# Patient Record
Sex: Female | Born: 1975 | Race: White | Hispanic: No | Marital: Single | Smoking: Former smoker
Health system: Southern US, Community
[De-identification: ages and names within clinical notes are randomized; demographics above are authoritative.]

## PROBLEM LIST (undated history)

## (undated) DIAGNOSIS — F419 Anxiety disorder, unspecified: Secondary | ICD-10-CM

## (undated) DIAGNOSIS — T7840XA Allergy, unspecified, initial encounter: Secondary | ICD-10-CM

## (undated) DIAGNOSIS — G43909 Migraine, unspecified, not intractable, without status migrainosus: Secondary | ICD-10-CM

## (undated) HISTORY — PX: TUBAL LIGATION: SHX77

## (undated) HISTORY — PX: ABLATION: SHX5711

## (undated) HISTORY — PX: JOINT REPLACEMENT: SHX530

## (undated) HISTORY — PX: HERNIA REPAIR: SHX51

## (undated) HISTORY — PX: FRACTURE SURGERY: SHX138

## (undated) HISTORY — DX: Anxiety disorder, unspecified: F41.9

## (undated) HISTORY — PX: FOOT SURGERY: SHX648

## (undated) HISTORY — DX: Allergy, unspecified, initial encounter: T78.40XA

---

## 1999-07-04 ENCOUNTER — Other Ambulatory Visit: Admission: RE | Admit: 1999-07-04 | Discharge: 1999-07-04 | Payer: Self-pay | Admitting: Family Medicine

## 2000-09-15 ENCOUNTER — Other Ambulatory Visit: Admission: RE | Admit: 2000-09-15 | Discharge: 2000-09-15 | Payer: Self-pay | Admitting: Neurology

## 2000-09-21 ENCOUNTER — Encounter: Payer: Self-pay | Admitting: Internal Medicine

## 2000-09-21 ENCOUNTER — Ambulatory Visit (HOSPITAL_COMMUNITY): Admission: RE | Admit: 2000-09-21 | Discharge: 2000-09-21 | Payer: Self-pay | Admitting: Internal Medicine

## 2000-11-10 ENCOUNTER — Ambulatory Visit (HOSPITAL_COMMUNITY): Admission: RE | Admit: 2000-11-10 | Discharge: 2000-11-10 | Payer: Self-pay | Admitting: Obstetrics and Gynecology

## 2000-11-10 ENCOUNTER — Encounter: Payer: Self-pay | Admitting: Obstetrics and Gynecology

## 2002-10-25 ENCOUNTER — Ambulatory Visit (HOSPITAL_COMMUNITY): Admission: AD | Admit: 2002-10-25 | Discharge: 2002-10-25 | Payer: Self-pay | Admitting: Obstetrics and Gynecology

## 2002-11-02 ENCOUNTER — Ambulatory Visit (HOSPITAL_COMMUNITY): Admission: RE | Admit: 2002-11-02 | Discharge: 2002-11-03 | Payer: Self-pay | Admitting: Obstetrics and Gynecology

## 2002-11-03 ENCOUNTER — Ambulatory Visit (HOSPITAL_COMMUNITY): Admission: AD | Admit: 2002-11-03 | Discharge: 2002-11-03 | Payer: Self-pay | Admitting: Internal Medicine

## 2002-11-04 ENCOUNTER — Inpatient Hospital Stay (HOSPITAL_COMMUNITY): Admission: AD | Admit: 2002-11-04 | Discharge: 2002-11-06 | Payer: Self-pay | Admitting: Obstetrics and Gynecology

## 2005-02-05 ENCOUNTER — Ambulatory Visit (HOSPITAL_COMMUNITY): Admission: RE | Admit: 2005-02-05 | Discharge: 2005-02-05 | Payer: Self-pay | Admitting: Obstetrics and Gynecology

## 2005-02-05 ENCOUNTER — Encounter: Payer: Self-pay | Admitting: Obstetrics and Gynecology

## 2006-03-09 HISTORY — PX: ESOPHAGOGASTRODUODENOSCOPY: SHX1529

## 2006-05-25 ENCOUNTER — Encounter: Admission: RE | Admit: 2006-05-25 | Discharge: 2006-05-25 | Payer: Self-pay | Admitting: Internal Medicine

## 2006-12-15 ENCOUNTER — Ambulatory Visit (HOSPITAL_COMMUNITY): Admission: RE | Admit: 2006-12-15 | Discharge: 2006-12-15 | Payer: Self-pay | Admitting: Gastroenterology

## 2006-12-15 ENCOUNTER — Ambulatory Visit: Payer: Self-pay | Admitting: Gastroenterology

## 2007-01-19 ENCOUNTER — Ambulatory Visit: Payer: Self-pay | Admitting: Urgent Care

## 2007-02-15 ENCOUNTER — Ambulatory Visit: Payer: Self-pay | Admitting: Gastroenterology

## 2007-02-15 ENCOUNTER — Ambulatory Visit (HOSPITAL_COMMUNITY): Admission: RE | Admit: 2007-02-15 | Discharge: 2007-02-15 | Payer: Self-pay | Admitting: Gastroenterology

## 2007-10-07 ENCOUNTER — Encounter: Payer: Self-pay | Admitting: Gastroenterology

## 2007-10-07 DIAGNOSIS — R109 Unspecified abdominal pain: Secondary | ICD-10-CM | POA: Insufficient documentation

## 2007-10-07 DIAGNOSIS — R112 Nausea with vomiting, unspecified: Secondary | ICD-10-CM | POA: Insufficient documentation

## 2007-10-07 DIAGNOSIS — K5909 Other constipation: Secondary | ICD-10-CM | POA: Insufficient documentation

## 2007-10-07 DIAGNOSIS — R630 Anorexia: Secondary | ICD-10-CM | POA: Insufficient documentation

## 2007-10-07 DIAGNOSIS — K449 Diaphragmatic hernia without obstruction or gangrene: Secondary | ICD-10-CM | POA: Insufficient documentation

## 2007-10-07 DIAGNOSIS — R1013 Epigastric pain: Secondary | ICD-10-CM | POA: Insufficient documentation

## 2010-07-22 NOTE — Op Note (Signed)
NAMEDHAMAR, GREGORY                 ACCOUNT NO.:  [id number]   MEDICAL RECORD NO.:  [id number]          PATIENT TYPE:  AMB   LOCATION:  DAY                           FACILITY:  [facility]   PHYSICIAN:  Kassie Mends, M.D.      DATE OF BIRTH:  25-Nov-1975   DATE OF PROCEDURE:  02/15/2007  DATE OF DISCHARGE:                               OPERATIVE REPORT   PROCEDURE PERFORMED:  Esophagogastroduodenoscopy.   INDICATION FOR EXAM:  Ms. Clifton Custard is a 35 year old female who was  initially seen for epigastric pain in October of 2008.  It was  associated with nausea, vomiting and anorexia.  She was scheduled for an  upper endoscopy but did not return for her scheduled appointment.  Initially, she was having marital problems and she does consume alcohol  twice a week.  The epigastric pain has been so significant that she has  been seen in the emergency department.  She was seen in the office in  November and changed from omeprazole to Zegerid.   FINDINGS:  1. Normal esophagus without evidence of Barrett's, mass, erosion or      constriction.  2. Small hiatal hernia, otherwise normal stomach.  3. Normal duodenal bulb and second portion of the duodenum.   DIAGNOSES:  Ms. Charlestine Night epigastric pain is either secondary to  gastritis, uncontrolled gastroesophageal reflux disease, or nonulcer  dyspepsia.   RECOMMENDATIONS:  1. She should continue her Zegerid daily.  2. Follow-up appointment in three months with Dr. Cira Servant to reassess      abdominal pain.   MEDICATIONS:  1. Demerol 75 mg IV  2. Versed 7 mg IV.   PROCEDURE TECHNIQUE:  Physical exam was performed and informed consent  was obtained from the patient after explaining the benefits, risks and  alternatives to the procedure.  The patient was connected to a monitor  and placed in left lateral position.  Continuous oxygen was provided by  nasal cannula and IV medicine administered through the indwelling  cannula.  After administration of sedation,  the patient's esophagus was  intubated and the  scope was advanced under direct visualization to the second portion of  the duodenum.  The scope was removed slowly by carefully examining the  color, texture, anatomy and integrity of the mucosa on the way out.  The  patient was recovered in the endoscopy suite and discharged home in  satisfactory condition.      Kassie Mends, M.D.  Electronically Signed     SM/MEDQ  D:  02/15/2007  T:  02/15/2007  Job:  [id number]   cc:   Doreen Beam  Fax: [phone]

## 2010-07-22 NOTE — H&P (Signed)
[name], Sweeney                 ACCOUNT NO.:  [id number]   MEDICAL RECORD NO.:  [id number]          PATIENT TYPE:  AMB   LOCATION:  DAY                           FACILITY:  [facility]   PHYSICIAN:  R. Roetta Sessions, M.D. DATE OF BIRTH:  04-22-1975   DATE OF ADMISSION:  DATE OF DISCHARGE:  LH                              HISTORY & PHYSICAL   PRIMARY CARE PHYSICIAN:  Dr. Sherril Croon.   CHIEF COMPLAINT:  Epigastric pain, nausea and vomiting.   HISTORY OF PRESENT ILLNESS:  Ms. Sara Sweeney is a 35 year old Caucasian  female.  She was initially seen by me on December 15, 2006.  She has a 13-  month history of intermittent epigastric pain that is always  postprandially.  She was seen in the office.  She was started on Zegerid  40 mg daily, which did seem to help her symptoms.  She was set up for  abdominal ultrasound, which was negative on December 15, 2006.  She was  doing quite well on her Zegerid; however, she ran out.  She did have  rare nausea and vomiting and epigastric pain while taking her Zegerid.  She tells me she went to eat at [facility].  She had chicken  quesadilla.  She had severe epigastric pain afterwards along with nausea  and vomiting.  She tells me any time she eats pasta or cheese, she has  symptoms, even when she was on PPI.  She is under a significant amount  of stress due to marital problems.  She tells me she gets very little  sleep.  She denies any history of anxiety and depression.  She denies  any dysphagia or odynophagia.  Denies any rectal bleeding or melena.   We had attempted to contact Ms. Sara Sweeney after her abdominal ultrasound to  give results and set her up for EGD if PPI was not working; however, we  contacted her multiple times and finally ended up mailing a letter.  Ms.  Charlestine Night epigastric pain was so severe on January 17, 2007, she  presented to [facility].  While there, she had a  CMP which was normal.  She had a lipase mildly elevated at 54.   Amylase  was normal.  She had a CBC, which at that time showed a white blood cell  count 9.5, hemoglobin 14.6, hematocrit 42.6 and platelet count of 184.  She had a urinalysis which showed trace ketones and trace blood, along  with occasional bacteria.   PAST MEDICAL AND SURGICAL HISTORY:  She has history of umbilical  herniorrhaphy.  Foot surgery, sinus surgery, myringotomy tubes, tubal  ligation, endometrial ablation four years ago.   CURRENT MEDICATIONS:  1. Xanax 1 mg b.i.d. p.r.n.  2. Protonix 40 mg daily was started in the emergency room yesterday.  3. She is on Ultram p.r.n.   ALLERGIES:  NO KNOWN DRUG ALLERGIES.   FAMILY HISTORY:  There is no known family history of carcinoma or  chronic GI problems.  Both parents are healthy.  She does have a sister  with history of astrocytoma.  SOCIAL HISTORY:  Ms. Sara Sweeney is married although admits to having marital  difficulties.  She had recently separated from husband and they are now  currently living back together again, although she admits that this may  not be permanent.  she has a 14 pack year history of tobacco use but  does not currently smoke.  She consumes a couple of alcoholic beverages  per week.  Denies any drug use.   REVIEW OF SYSTEMS:  See HPI, otherwise negative.   PHYSICAL EXAMINATION:  VITAL SIGNS:  Weight is 127 pounds, height 64  inches, temperature 98.1 degrees, blood pressure 92/70, pulse 72.  GENERAL:  Ms. Sara Sweeney is a 35 year old Caucasian female who is alert,  cooperative, in no acute distress.  HEENT:  Sclerae clear, nonicteric.  Conjunctivae pink.  Oropharynx pink  and moist without lesions.  NECK: Supple without thyromegaly.  CHEST: Heart regular rhythm, normal S1, S2.  No murmurs, clicks, rubs or  gallops.  RESPIRATORY:  Lungs clear to auscultation bilaterally.  ABDOMEN:  Positive bowel sounds x4.  No bruits auscultated.  Soft,  nontender and nondistended without palpable mass or hepatosplenomegaly.   No rebound tenderness or guarding.  EXTREMITIES:  Without clubbing or edema bilaterally.  SKIN:  Pink, warm, dry without any rash or jaundice.   IMPRESSION:  Ms. Sara Sweeney is a 35 year old Caucasian female with  intermittent epigastric pain along with nausea and vomiting.  Her  symptoms are usually postprandial.  Gallbladder ultrasound was normal.  I suspect she may have gastritis or peptic ulcer disease to account for  her symptoms and warrants further evaluation with EGD.  If EGD is  benign, would consider acalculous gallbladder disease and further  workup.   PLAN:  1. EGD with Dr. Cira Servant in the near future.  Discuss the procedure,      risks, benefits, including but not limited to the risk of      infection, perforation, drug reaction.  She agrees to plan.      Consent will be obtained.  2. Resume Zegerid 40 mg daily.  I have given her bottle of samples and      she has a prescription for 30 with two refills.  We have given her      a rebate card.  3. She has Ultram on hand for severe pain, although she notes this has      been causing headache.  She denies any further pain medicines at      this time.      Lorenza Burton, N.P.      Jonathon Bellows, M.D.  Electronically Signed    KJ/MEDQ  D:  01/19/2007  T:  01/20/2007  Job:  [id number]   cc:   Doreen Beam  Fax: [phone]

## 2010-07-22 NOTE — Assessment & Plan Note (Signed)
NAME:  VERENA, SHAWGO                  CHART#:  [id number]   DATE:  12/15/2006                       DOB:  10/15/1975   CHIEF COMPLAINT:  Epigastric pain, nausea, vomiting.   HISTORY OF PRESENT ILLNESS:  Ms. Clifton Custard is a 35 year old Caucasian female  who tells me for the last year she has had intermittent epigastric pain  that is always is postprandial.  It is associated with nausea and  vomiting, as well as anorexia.  The pain can last anywhere from 1/2 hour  to all day long.  She has episodes, generally a couple of days per week,  but not necessarily every day.  She describes the pain as 8/10 on pain  scale, and aching.  She does complain of indigestion throughout the week  as well.  She denies any dysphagia or odynophagia.  Does have some  anorexia.  Denies any early satiety.  She has taken Carafate 1 gm  q.i.d., which does not seem to help.  She has been taking a trial of  Nexium 40 mg daily, but does admit to not taking it as prescribed.  She  is taking it only when needed.  She has not noticed any difference with  her symptoms with the Nexium.  She has chronic constipation.  She  generally has 1 to 2 bowel movements a week.  She denies any rectal  bleeding or melena.   PAST MEDICAL HISTORY:  1. She presently has URI.  2. History of umbilical herniorrhaphy.  3. Foot surgery.  4. Sinus surgery.  5. Myringotomy tubes.  6. Tubal ligation and endometrial ablation 4 years ago.   CURRENT MEDICATIONS:  1. Carafate 1 gm q.i.d.  2. Xanax 1 mg b.i.d. p.r.n.  3. Allegra 180 mg daily.  4. Amoxicillin 500 mg b.i.d. for 7 days.  5. Robitussin over-the-counter p.r.n.  6. Nexium 40 mg daily p.r.n.   ALLERGIES:  NO KNOWN DRUG ALLERGIES.   FAMILY HISTORY:  No known family history of colorectal carcinoma, liver,  or GI problems.  Both parents are healthy.  She does have a sister with  astrocytoma.   SOCIAL HISTORY:  Ms. Clifton Custard is married.  She has 2 healthy children ages  65 and 83.  She is  employed with [employer].  She has a 14-pack-year  history of tobacco use, but does not currently smoke.  She consumes a  couple of alcoholic beverages per week.  Denies any drug use.   REVIEW OF SYSTEMS:  See HPI.  Her weight is up 8 pounds in the last  year, otherwise negative.   PHYSICAL EXAMINATION:  VITAL SIGNS:  Weight 128 pounds.  Height 64  inches.  Temperature 97.7.  Blood pressure 110/70.  Pulse 68.  GENERAL:  She is a well-developed, well-nourished, Caucasian female in  no acute distress.  HEENT:  Sclerae are clear.  Not injected.  Oropharynx pink and moist  without any lesions.  NECK:  Supple without adenopathy or thyromegaly.  CHEST:  Heart regular rate and rhythm.  Normal S1 and S2.  No murmurs,  rubs, gallops.  LUNGS:  Clear to auscultation bilaterally.  ABDOMEN:  Positive bowel sounds x4.  No bruits auscultated.  Soft and  non-distended.  She does have significant Murphy's point tenderness, as  well as tenderness to her entire upper  abdomen.  There is no rebound  tenderness or guarding.  No hepatosplenomegaly or mass.  EXTREMITIES:  Without clubbing or edema bilaterally.  SKIN:  Pink, warm, and dry without rash or jaundice.   IMPRESSION:  Ms. Clifton Custard is a 35 year old Caucasian female with a 1-year  history of intermittent severe epigastric pain, which is always  postprandial, and associated with nausea and vomiting.  She also  complains of indigestion and chronic constipation.  She admits to not  taking proton pump inhibitor on a daily basis, and giving an adequate  trial for gastroesophageal reflux disease.  I suspect that we could be  dealing with gallbladder disease as well.   PLAN:  1. Constipation literature reviewed.  2. Maalox 17 gm daily.  3. Zegerid 40 mg daily, samples given for 2 weeks.  She is to take      every day.  4. Abdominal ultrasound.  5. If abdominal ultrasound is normal, would proceed with EGD for      further evaluation by Dr. Cira Servant.  I have  discussed the procedure,      risks, and benefits including, but not limited to infection,      perforation, and consent to be obtained if necessary.       Lorenza Burton, N.P.  Electronically Signed     Kassie Mends, M.D.  Electronically Signed    KJ/MEDQ  D:  12/16/2006  T:  12/16/2006  Job:  [id number]   cc:   Doreen Beam

## 2010-07-25 NOTE — Op Note (Signed)
NAMELORRY, FURBER NO.:  [id number]   MEDICAL RECORD NO.:  [id number]          PATIENT TYPE:  AMB   LOCATION:  DAY                           FACILITY:  [facility]   PHYSICIAN:  Tilda Burrow, M.D. DATE OF BIRTH:  10/12/1975   DATE OF PROCEDURE:  02/05/2005  DATE OF DISCHARGE:                                 OPERATIVE REPORT   PREOPERATIVE DIAGNOSIS:  Elective sterilization, menorrhagia.   POSTOPERATIVE DIAGNOSIS:  Elective sterilization, menorrhagia.   PROCEDURE:  Hysterectomy and dilation and curettage, endometrial ablation,  laparoscopic tubal sterilization with Falope rings.   SURGEON:  Dr. Emelda Fear.   ASSISTANT:  Lewellyn_ C.S.T.   ANESTHESIA:  General, Idacavage, C.R.N.A.   COMPLICATIONS:  None.   FINDINGS:  Shaggy endometrial. This patient is beginning a menses, easily  cleared with curettage, normal appearing tubes and ovaries bilaterally.   DETAILS OF PROCEDURE:  The patient was taken to the operating room, prepped  and draped in the usual fashion for combined abdominal and vaginal procedure  with legs in low lithotomy support with patient prepped and draped for  combined abdominal and vaginal procedure. The vaginal portion was initiated  with speculum insertion, grasping the anterior lip of the cervix with a  single-toothed tenaculum, dilating the cervix to 21 [place] after uterine  sounding to 9 cm. The HTA endometrial ablation device was then inserted to  use as a hysteroscope initially, visualizing the shaggy endometrium. We then  removed the shaggy endometrium with four-quadrant curettage. Repeat  hysteroscopy revealed a dramatically improved appearance of the endometrial  cavity. The generous curettings were sent as a histologic specimen.   The HTA endometrial ablation process then was executed, and a 10-minute  thermal process utilized, with satisfactory thermal changes documented by  photography, pre and post procedure. There was no  significant fluid loss. An  Allis clamp was placed on the cervix to maximize the satisfactory seal of  the endocervix. This worked excellently. The thermal changes were completed,  the hysteroscopic ablation fluid cooled down and removed, the hysteroscope  removed, and then procedure considered successfully completed. Paracervical  block with 20 cc of Marcaine with epinephrine was then instilled into the  area lateral and posterior to the cervix, and then we proceeded with  placement of the single-toothed tenaculum on the cervix, Hulka tenaculum  placed in the uterus, in and out catheterization of the bladder performed.  Tubal ligation was then performed. We changed gloves and gowns, flipped the  protective drape over the vaginal area, and then proceeded with laparoscopic  tubal sterilization in standard fashion which consisted of infraumbilical  vertical 1-cm skin incision as well as a transverse suprapubic 1-cm skin  incision. Veress needle was introduced through the umbilical incision while  elevating the abdominal wall, oriented the needle toward the pelvis, and  loss-of-resistance technique confirmed satisfactory intraperitoneal  location. Two liters of CO2 was introduced under 4 mm pressure, the 5-mm  laparoscopic trocar introduced through the umbilical incision, confirming  normal appearing bowel. The cul-de-sac was inspected, and there was no  significant fluid, again confirming no  extravasation from the tubes from the  thermal ablation procedure. Each fallopian tube was then identified to each  fimbriated end, found to be free of adhesions as was the ovary. We then  elevated the tube and placed a mid segment knuckle of tube in a Falope ring.  The ring applier had been placed through the suprapubic trocar site using an  8-mm sleeve. The Falope ring was applied on the left tube first. The tube  was then infiltrated with 5 cc of Marcaine with epinephrine, then we  inspected the over  side and performed a similar procedure. Satisfactory  placement of the ring was photo documented. Saline 200 cc was instilled into  the abdomen. The first 100 cc were slightly cooled than desired, but second  100 cc was normal temperature for the patient. We then proceeded to remove  laparoscopic equipment, closed the incisions with subcuticular 4-0 Dexon,  closed the skin with Steri-Strips and allowed the patient to go to the  recovery room in good condition.   DISCHARGE MEDICATIONS:  1.  Tylox x20 tablets.  2.  Motrin 600 mg every 6 hours x30 tablets.  3.  Phenergan 25 mg tablets x10 tablets 1 every 6 hours as needed.   The patient went home after going to recovery room in stable condition.      Tilda Burrow, M.D.  Electronically Signed     JVF/MEDQ  D:  02/05/2005  T:  02/05/2005  Job:  [id number]

## 2010-07-25 NOTE — Discharge Summary (Signed)
   NAME:  Sara Sweeney, Sara Sweeney                           ACCOUNT NO.:  [id number]   MEDICAL RECORD NO.:  [id number]                   PATIENT TYPE:  INP   LOCATION:  LDR5                                 FACILITY:  [facility]   PHYSICIAN:  Langley Gauss, M.D.                DATE OF BIRTH:  06-Nov-1975   DATE OF ADMISSION:  11/04/2002  DATE OF DISCHARGE:  11/06/2002                                 DISCHARGE SUMMARY   DIAGNOSIS:  Term pregnancy in labor.  Delivery performed by Dr. Roylene Reason.  Lisette Grinder on cross-coverage arrangement with [facility].   ADDITIONAL PROCEDURES:  1. Continuous lumbar epidural analgesia placed on November 04, 2002 by Dr.     Roylene Reason. Lisette Grinder.  2. On November 06, 2002 infant circumcision performed without complications by     Dr. Roylene Reason. Lisette Grinder.   PERTINENT LABORATORY STUDIES:  Admission hemoglobin and hematocrit 10.3/31.6  with white count of 12.0; postpartum day #1 10.0/30.9 with a white count of  13.9.  B positive blood type.   DISCHARGE MEDICATIONS:  Darvocet-N 100.   DISPOSITION:  To follow up with Dr. Duane Lope in four weeks' time.   HOSPITAL COURSE:  See previous dictations.  The patient did well postpartum.  She had no postpartum complications, bonded well with the infant.  She was  thus prepared for discharge on postpartum day #1.5.  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  She was treated  during the course of labor with IV ampicillin and subsequently the infant  was observed x48 hours postpartum per pediatrician.                                               Langley Gauss, M.D.    DC/MEDQ  D:  11/07/2002  T:  11/07/2002  Job:  [id number]   cc:   [facility]

## 2010-07-25 NOTE — Op Note (Signed)
   NAME:  Sara Sweeney, Sara Sweeney                           ACCOUNT NO.:  [id number]   MEDICAL RECORD NO.:  [id number]                   PATIENT TYPE:  INP   LOCATION:  LDR5                                 FACILITY:  [facility]   PHYSICIAN:  Langley Gauss, M.D.                DATE OF BIRTH:  07/12/1975   DATE OF PROCEDURE:  11/04/2002  DATE OF DISCHARGE:                                 OPERATIVE REPORT   PROCEDURE:  Placement of continuous lumbar epidural analgesia at the L2-L3  interspace.   DESCRIPTION OF PROCEDURE:  Appropriate informed consent was obtained.  Fetal  heart rate [place] reassuring.  The patient is placed in the seated position.  Bony landmarks were identified.  The L2-L3 interspace was chosen.  The  patient's back is sterilely prepped and draped in the utilizing epidural  tray and 5 mL of 1% lidocaine plain injected at the midline of the L2-3  interspace to raise a small skin weal.  The 17-gauge Tuohy-Schliff needle  was then utilized with loss-of-resistance and air-filled glass syringe to  easily and atraumatically identify entry into the epidural space on the  first attempt without difficulty.  Excellent loss-of-resistance was noted  and 5 mL 1.5% lidocaine plus epinephrine injected through the epidural  needle.  No signs of CSF or intravascular injection obtained.  Thus, the  epidural catheter is inserted to a depth of 5 cm. The epidural needle is  removed.  Aspiration test is negative.  Second test dose, 2 mL 1.5%  lidocaine plus epinephrine injected through the epidural catheter.  Again no  signs of CSF or intravascular injection obtained.  Thus  a second test dose  of 3 mL of 1.5% lidocaine plus epinephrine injected through the epidural  catheter.  Again no signs of CSF or intravacular injection obtained.  The  patient is having tingling in the feet consistent with a setting up epidural  block.  The catheter is secured in place.  The patient is connected to the  infusion pump with  the standard mixture.  She will be treated with a bolus  of 10 mL followed by continuous infusion rate of 14 mL per hour. The patient  is thereafter returned to the lateral supine position at which time she  continues to have a reassuring fetal heart rate and there is evidence of a  excellent bilateral block setting up which should be adequate for labor  analgesia.                                               Langley Gauss, M.D.    DC/MEDQ  D:  11/04/2002  T:  11/04/2002  Job:  [id number]

## 2010-07-25 NOTE — H&P (Signed)
NAME:  Sara Sweeney, Sara Sweeney                           ACCOUNT NO.:  [id number]   MEDICAL RECORD NO.:  [id number]                   PATIENT TYPE:  INP   LOCATION:  LDR5                                 FACILITY:  [facility]   PHYSICIAN:  Langley Gauss, M.D.                DATE OF BIRTH:  20-Jun-1975   DATE OF ADMISSION:  11/04/2002  DATE OF DISCHARGE:                                HISTORY & PHYSICAL   ADMISSION HISTORY AND PHYSICAL:  A 35 year old gravida 2, para 1, 39-1/[redacted]  weeks gestation who presents to [facility] in active labor.  The  patient has been seen the previous two days in prodromal labor with no  cervical change; however, she presents today complaining of increased  frequency in intensity of uterine contractions, and on initial examination,  by the nursing staff, the cervix is noted to have changed to 3-cm.  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  She will thus require treatment during labor in an  effort to prevent vertical transmission to the infant.  The patient has had  serial ultrasounds which documented adequate fetal growth and a normal  anatomic survey.  Remainder of prenatal course uncomplicated.   PAST MEDICAL HISTORY:   OB HISTORY:  Spontaneous assisted vaginal delivery x 1, 1997, in [place], 8  pound 7 ounce female infant, delivered without complication.  The patient  describes her labor as being rapid.   SURGICAL HISTORY:  1. Patient has had right foot surgery.  2. Tubes placed in her ears.  3. Nasal polyp removal.   She has no known drug allergies.   CURRENT MEDICATIONS:  Include prenatal vitamins only.   PHYSICAL EXAMINATION:  GENERAL:  A pleasant white female, no acute distress.  VITAL SIGNS:  Height 5 foot 5, 110/70, pulse is 76, respiratory rate is 22.  HEENT:  Negative.  No adenopathy.  NECK:  Supple.  Thyroid is not palpable.  LUNGS:  Clear.  CARDIOVASCULAR:  Regular, rate and  rhythm.  ABDOMEN:  Soft and nontender.  No surgical scars are identified.  The  patient is vertex presentation by [facility] maneuver with a fundal height of  38-cm.  EXTREMITIES:  Noted to be normal.  PELVIC:  Normal external genitalia.  No lesions or ulcerations identified.  No leakage or vaginal bleeding.  Pelvis is noted to be clinically adequate.  Initial examination by the nursing staff, 3-cm; thereafter, the patient  progressed rapidly along the labor curve to a 6-cm dilatation with a  reassuring fetal heart rate, contractions occurring every four to seven  minutes.  The patient did experience spontaneous rupture of membranes,  during this hospitalization, with findings of slightly particulate and green  meconium stained amniotic fluid.   ASSESSMENT/PLAN:  1. Patient presents in active labor.  She does desire epidural analgesic for  labor relief.  2. Amnioinfusion will be performed in an effort to dilute the meconium.  3. Patient will require treatment with intravenous Ampicillin during the     course of labor due to the Group B streptococcus carrier status.                                               Langley Gauss, M.D.    DC/MEDQ  D:  11/04/2002  T:  11/04/2002  Job:  [id number]   cc:   [facility]

## 2010-07-25 NOTE — Op Note (Signed)
NAME:  Sara Sweeney, Sara Sweeney                           ACCOUNT NO.:  [id number]   MEDICAL RECORD NO.:  [id number]                   PATIENT TYPE:  INP   LOCATION:  LDR1                                 FACILITY:  [facility]   PHYSICIAN:  Langley Gauss, M.D.                DATE OF BIRTH:  January 02, 1976   DATE OF PROCEDURE:  11/04/2002  DATE OF DISCHARGE:                                 OPERATIVE REPORT   DELIVERY NOTE:   DIAGNOSES:  1. Thirty-nine and one-half week intrauterine pregnancy presenting in     active.  2. Mildly particulate meconium stain of the amniotic fluid.   OTHER PROCEDURES:  1. Amnio infusion in an effort to dilute the meconium fluid, 1000 mL of     sterile normal saline is instilled through the intrauterine pressure     catheter monitor.  2. Placement of continuous lumbar epidural analgesia by Dr. Roylene Reason.     Lisette Grinder, epidural start time 1400,  Epidural discontinuation time 1830.   COMPLICATIONS:  Complications of labor and delivery include a prolonged  fetal heart rate deceleration of 80-90 beats per minute secondary to  hypotension.  The patient had only received 1 L of IV fluids total before  epidural was placed.  The hypotension resolved with treatment with  additional fluid bolus as well as 5 mg of IV ephedrine.   SUMMARY:  A 35 year old gravida 2, para 1, 39-1/[redacted] weeks gestation, presents  in active labor.  Initial examination is 3 cm dilated, but after progression  to 6 cm at which time the patient had received 1 mg of IV Nubain for pain  relief, she did have to request epidural.  Epidural was placed without  difficulty and functioned very well through the remainder of the labor  course.  The patient was noted to have excellent bilateral block in place.  She was examined and noted to be 10 cm.  However, contractions were  irregular every five to eight minutes at that time, thus Pitocin  augmentation was initiated for findings of dysfunctional labor with  inadequate frequency and intensity of uterine contractions.  After  initiating the Pitocin and turning off the epidural pump, the patient began  having pressure and urge to push.  She was thus placed in the dorsal  lithotomy position and prepped and draped in the usual sterile manner.  Preparations were made to deal with meconium stained amniotic fluid,  specifically with a DeLee suction catheter kit connected to wall suction.  Pediatrician, Dr. Vivia Ewing, had previously been notified and was on his  way in for delivery.  With the patient now in the dorsal lithotomy position,  Foley catheter was removed.  The patient pushed very well with descent to  the vertex to the perineal floor with distention of the perineum.  A total  of 30 mL of 1% lidocaine plain is injected in the perineal body.  A small  midline episiotomy was performed.  The mother pushed well.  The infant  delivered in a direct OA position over the midline episiotomy without  extension.  Mouth and nares of the instrument were DeLee suctioned on the  perineum prior to the delivery of the shoulders.  Prior to the first breath  or cry, 3 mL of straw-colored meconium stained amniotic fluid is obtained.  Continuous expulsive efforts resulted in delivery of the remainder of the  infant.  The umbilical cord was milked towards the infant.  Cord is  thoroughly clamped and cut and the infant is noted to have some spontaneous  breathing and crying as I handed the baby off to the nurses at the nursery  table.  Arterial cord gas and cord blood are then obtained.  Gentle traction  on the umbilicus cord results in separation which upon examination appears  to be an intact placenta associated through three vessel umbilical cord.  Excellent uterine tone is achieved.  Examination of the genital tract  reveals midline episiotomy which is not extended.  This is reperitonealized  utilizing a 2-0 Vicryl continuous suture in the deep muscle layer of  the  perineal body, most specifically the superficial transverse perineal muscle  which is then reapproximated midline.  The vaginal mucosa is closed with a  continuous running 0 chromic in a running locked fascia followed by a single  layer of 0 chromic on the perineal body which restores the normal anatomy.  The patient is then taken out of the dorsal lithotomy position and rolled to  her side at which time the epidural was removed with the blue tip noted the  be intact.  Both the mother and the infant doing very well following  delivery.  There were no complications.  Total estimated blood loss less  than 500 mL.                                                Langley Gauss, M.D.    DC/MEDQ  D:  11/04/2002  T:  11/04/2002  Job:  [id number]

## 2014-05-29 ENCOUNTER — Emergency Department (HOSPITAL_COMMUNITY)
Admission: EM | Admit: 2014-05-29 | Discharge: 2014-05-29 | Disposition: A | Discharge status: Home / Self Care | Payer: Self-pay | Attending: Emergency Medicine | Admitting: Emergency Medicine

## 2014-05-29 ENCOUNTER — Encounter (HOSPITAL_COMMUNITY): Payer: Self-pay | Admitting: Neurology

## 2014-05-29 DIAGNOSIS — Z8679 Personal history of other diseases of the circulatory system: Secondary | ICD-10-CM | POA: Insufficient documentation

## 2014-05-29 DIAGNOSIS — R519 Headache, unspecified: Secondary | ICD-10-CM

## 2014-05-29 DIAGNOSIS — R51 Headache: Secondary | ICD-10-CM | POA: Insufficient documentation

## 2014-05-29 DIAGNOSIS — Z87891 Personal history of nicotine dependence: Secondary | ICD-10-CM | POA: Insufficient documentation

## 2014-05-29 DIAGNOSIS — M542 Cervicalgia: Secondary | ICD-10-CM | POA: Insufficient documentation

## 2014-05-29 HISTORY — DX: Migraine, unspecified, not intractable, without status migrainosus: G43.909

## 2014-05-29 MED ORDER — NAPROXEN 250 MG PO TABS
500.0000 mg | ORAL_TABLET | Freq: Once | ORAL | Status: AC
Start: 2014-05-29 — End: 2014-05-29
  Administered 2014-05-29: 500 mg via ORAL
  Filled 2014-05-29: qty 2

## 2014-05-29 MED ORDER — OXYCODONE-ACETAMINOPHEN 5-325 MG PO TABS
1.0000 | ORAL_TABLET | Freq: Once | ORAL | Status: AC
  Administered 2014-05-29: 1 via ORAL
  Filled 2014-05-29: qty 1

## 2014-05-29 MED ORDER — NAPROXEN 500 MG PO TABS: 500.0000 mg | ORAL_TABLET | Freq: Two times a day (BID) | ORAL | Status: DC

## 2014-05-29 NOTE — ED Provider Notes (Signed)
CSN: [id number]     Arrival date & time 05/29/14  1046 History   First MD Initiated Contact with Patient 05/29/14 1204     Chief Complaint  Patient presents with  . Neck Pain   (Consider location/radiation/quality/duration/timing/severity/associated sxs/prior Treatment) HPI  Sara Sweeney is a 39 yo female presenting with scalp pain.  She reports she first noticed she felt soreness in her neck 1 week ago.  She had full range of motion but it felt tender when she turned her head.  A day later the soreness in her neck muscles improved but she noticed pain in the back of her scalp. She reports a migraine headache 4 days ago [facility].  She reports a head CT there that was negative.  She currently denies any headache, nausea, vomiting, focal neuro deficits, fever, chills or neck stiffness.    Past Medical History  Diagnosis Date  . Migraine    History reviewed. No pertinent past surgical history. No family history on file. History  Substance Use Topics  . Smoking status: Former Games developermoker  . Smokeless tobacco: Not on file  . Alcohol Use: No   OB History    No data available     Review of Systems  Constitutional: Negative for fever and chills.  HENT: Negative for ear pain and sore throat.   Eyes: Negative for visual disturbance.  Respiratory: Negative for shortness of breath.   Cardiovascular: Negative for chest pain.  Gastrointestinal: Negative for nausea and vomiting.  Genitourinary: Negative for dysuria.  Musculoskeletal: Negative for myalgias.  Skin: Negative for rash.  Neurological: Positive for headaches. Negative for weakness and numbness.  Hematological: Positive for adenopathy.    Allergies  Review of patient's allergies indicates no known allergies.  Home Medications   Prior to Admission medications   Not on File   BP 111/66 mmHg  Pulse 77  Temp(Src) 98.3 F (36.8 C) (Oral)  Resp 23  Ht 5\' 3"  (1.6 m)  Wt 160 lb  (72.576 kg)  BMI 28.35 kg/m2  SpO2 100% Physical Exam  Constitutional: She appears well-developed and well-nourished. No distress.  HENT:  Head: Normocephalic and atraumatic.  Right Ear: Tympanic membrane normal.  Left Ear: Tympanic membrane normal.  Eyes: Conjunctivae are normal.  Neck: Neck supple. No thyromegaly present.  Cardiovascular: Normal rate, regular rhythm and intact distal pulses.   Pulmonary/Chest: Effort normal and breath sounds normal. No respiratory distress. She has no wheezes. She has no rales. She exhibits no tenderness.  Abdominal: Soft. There is no tenderness.  Musculoskeletal: She exhibits no tenderness.  Lymphadenopathy:       Head (left side): Occipital adenopathy present.    She has no cervical adenopathy.  Neurological: She is alert. She has normal strength. No cranial nerve deficit or sensory deficit. GCS eye subscore is 4. GCS verbal subscore is 5. GCS motor subscore is 6.  Cranial nerves 2-12 intact  Skin: Skin is warm and dry. No rash noted. She is not diaphoretic.  Psychiatric: She has a normal mood and affect.  Nursing note and vitals reviewed.   ED Course  Procedures (including critical care time) Labs Review Labs Reviewed - No data to display  Imaging Review No results found.   EKG Interpretation None      MDM   Final diagnoses:  Scalp pain  Neck pain   39 yo with swollen lymph node in occipital scalp. Discussed case with Dr. Patria Maneampos.  Treated with NSAIDS  and pain meds in the ED. Pt is well-appearing, in no acute distress and vital signs reviewed and not concerning. She appears safe to be discharged.  Discharge include follow-up with their PCP and treatment with NSAIDS.  Return precautions provided. Pt aware of plan and in agreement.     Filed Vitals:   05/29/14 1215 05/29/14 1230 05/29/14 1245 05/29/14 1300  BP: 111/66 103/64 118/75 126/69  Pulse: 77 62 64 79  Temp:      TempSrc:      Resp: Height:      Weight:       SpO2: 100% 100% 99% 99%   Meds given in ED:  Medications  naproxen (NAPROSYN) tablet 500 mg (500 mg Oral Given 05/29/14 1259)  oxyCODONE-acetaminophen (PERCOCET/ROXICET) 5-325 MG per tablet 1 tablet (1 tablet Oral Given 05/29/14 1259)    Discharge Medication List as of 05/29/2014 12:58 PM    START taking these medications   Details  naproxen (NAPROSYN) 500 MG tablet Take 1 tablet (500 mg total) by mouth 2 (two) times daily with a meal., Starting 05/29/2014, Until Discontinued, Print            Harle Battiest, NP 05/29/14 2349  Azalia Bilis, MD 05/31/14 [phone]

## 2014-05-29 NOTE — ED Notes (Signed)
Pt comfortable with discharge and follow up instructions. Pt declines wheelchair, escorted to waiting area by this RN. Prescriptions x1. 

## 2014-05-29 NOTE — Discharge Instructions (Signed)

## 2014-05-29 NOTE — ED Notes (Signed)
Pt reports neck pain 1 week ago, turned into migraine on Sunday. Reports continued pain to base of head and sore to touch to back of neck and knot. Denies fall or injury. Has hx of migraines. Pt is a x 4

## 2014-05-29 NOTE — ED Notes (Signed)
Pt placed in gown and in bed. Pt monitored by pulse ox, bp cuff, and 5-lead. 

## 2014-10-01 ENCOUNTER — Emergency Department (HOSPITAL_COMMUNITY): Payer: Self-pay

## 2014-10-01 ENCOUNTER — Emergency Department (HOSPITAL_COMMUNITY)
Admission: EM | Admit: 2014-10-01 | Discharge: 2014-10-01 | Disposition: A | Discharge status: Home / Self Care | Payer: Self-pay | Attending: Emergency Medicine | Admitting: Emergency Medicine

## 2014-10-01 ENCOUNTER — Encounter (HOSPITAL_COMMUNITY): Payer: Self-pay | Admitting: *Deleted

## 2014-10-01 DIAGNOSIS — Y9389 Activity, other specified: Secondary | ICD-10-CM | POA: Insufficient documentation

## 2014-10-01 DIAGNOSIS — Z87891 Personal history of nicotine dependence: Secondary | ICD-10-CM | POA: Insufficient documentation

## 2014-10-01 DIAGNOSIS — X58XXXA Exposure to other specified factors, initial encounter: Secondary | ICD-10-CM | POA: Insufficient documentation

## 2014-10-01 DIAGNOSIS — Y998 Other external cause status: Secondary | ICD-10-CM | POA: Insufficient documentation

## 2014-10-01 DIAGNOSIS — Y9289 Other specified places as the place of occurrence of the external cause: Secondary | ICD-10-CM | POA: Insufficient documentation

## 2014-10-01 DIAGNOSIS — M5030 Other cervical disc degeneration, unspecified cervical region: Secondary | ICD-10-CM | POA: Insufficient documentation

## 2014-10-01 DIAGNOSIS — R51 Headache: Secondary | ICD-10-CM | POA: Insufficient documentation

## 2014-10-01 DIAGNOSIS — Z8679 Personal history of other diseases of the circulatory system: Secondary | ICD-10-CM | POA: Insufficient documentation

## 2014-10-01 DIAGNOSIS — R202 Paresthesia of skin: Secondary | ICD-10-CM | POA: Insufficient documentation

## 2014-10-01 MED ORDER — TRAMADOL HCL 50 MG PO TABS: ORAL_TABLET | ORAL | Status: DC

## 2014-10-01 MED ORDER — ONDANSETRON HCL 4 MG PO TABS
4.0000 mg | ORAL_TABLET | Freq: Once | ORAL | Status: AC
  Administered 2014-10-01: 4 mg via ORAL
  Filled 2014-10-01: qty 1

## 2014-10-01 MED ORDER — IBUPROFEN 600 MG PO TABS: 600.0000 mg | ORAL_TABLET | Freq: Four times a day (QID) | ORAL | Status: DC

## 2014-10-01 MED ORDER — IBUPROFEN 800 MG PO TABS
800.0000 mg | ORAL_TABLET | Freq: Once | ORAL | Status: AC
  Administered 2014-10-01: 800 mg via ORAL
  Filled 2014-10-01: qty 1

## 2014-10-01 MED ORDER — ACETAMINOPHEN-CODEINE #3 300-30 MG PO TABS: 1.0000 | ORAL_TABLET | Freq: Four times a day (QID) | ORAL | Status: DC | PRN

## 2014-10-01 MED ORDER — ACETAMINOPHEN-CODEINE #3 300-30 MG PO TABS
2.0000 | ORAL_TABLET | Freq: Once | ORAL | Status: DC
  Filled 2014-10-01: qty 2

## 2014-10-01 MED ORDER — TRAMADOL HCL 50 MG PO TABS
100.0000 mg | ORAL_TABLET | Freq: Once | ORAL | Status: AC
  Administered 2014-10-01: 100 mg via ORAL
  Filled 2014-10-01: qty 2

## 2014-10-01 MED ORDER — PROMETHAZINE HCL 25 MG RE SUPP: 25.0000 mg | Freq: Four times a day (QID) | RECTAL | Status: DC | PRN

## 2014-10-01 NOTE — Discharge Instructions (Signed)
Your x-ray suggests degenerative disc disease involving her neck. Your examination questions possible carpal tunnel problems involving the wrist. Please see Dr Luiz Blare for additional evaluation of this issue. Degenerative Disk Disease Degenerative disk disease is a condition caused by the changes that occur in the cushions of the backbone (spinal disks) as you grow older. Spinal disks are soft and compressible disks located between the bones of the spine (vertebrae). They act like shock absorbers. Degenerative disk disease can affect the whole spine. However, the neck and lower back are most commonly affected. Many changes can occur in the spinal disks with aging, such as:  The spinal disks may dry and shrink.  Small tears may occur in the tough, outer covering of the disk (annulus).  The disk space may become smaller due to loss of water.  Abnormal growths in the bone (spurs) may occur. This can put pressure on the nerve roots exiting the spinal canal, causing pain.  The spinal canal may become narrowed. CAUSES  Degenerative disk disease is a condition caused by the changes that occur in the spinal disks with aging. The exact cause is not known, but there is a genetic basis for many patients. Degenerative changes can occur due to loss of fluid in the disk. This makes the disk thinner and reduces the space between the backbones. Small cracks can develop in the outer layer of the disk. This can lead to the breakdown of the disk. You are more likely to get degenerative disk disease if you are overweight. Smoking cigarettes and doing heavy work such as weightlifting can also increase your risk of this condition. Degenerative changes can start after a sudden injury. Growth of bone spurs can compress the nerve roots and cause pain.  SYMPTOMS  The symptoms vary from person to person. Some people may have no pain, while others have severe pain. The pain may be so severe that it can limit your activities. The  location of the pain depends on the part of your backbone that is affected. You will have neck or arm pain if a disk in the neck area is affected. You will have pain in your back, buttocks, or legs if a disk in the lower back is affected. The pain becomes worse while bending, reaching up, or with twisting movements. The pain may start gradually and then get worse as time passes. It may also start after a major or minor injury. You may feel numbness or tingling in the arms or legs.  DIAGNOSIS  Your caregiver will ask you about your symptoms and about activities or habits that may cause the pain. He or she may also ask about any injuries, diseases, or treatments you have had earlier. Your caregiver will examine you to check for the range of movement that is possible in the affected area, to check for strength in your extremities, and to check for sensation in the areas of the arms and legs supplied by different nerve roots. An X-ray of the spine may be taken. Your caregiver may suggest other imaging tests, such as magnetic resonance imaging (MRI), if needed.  TREATMENT  Treatment includes rest, modifying your activities, and applying ice and heat. Your caregiver may prescribe medicines to reduce your pain and may ask you to do some exercises to strengthen your back. In some cases, you may need surgery. You and your caregiver will decide on the treatment that is best for you. HOME CARE INSTRUCTIONS   Follow proper lifting and walking techniques as  advised by your caregiver.  Maintain good posture.  Exercise regularly as advised.  Perform relaxation exercises.  Change your sitting, standing, and sleeping habits as advised. Change positions frequently.  Lose weight as advised.  Stop smoking if you smoke.  Wear supportive footwear. SEEK MEDICAL CARE IF:  Your pain does not go away within 1 to 4 weeks. SEEK IMMEDIATE MEDICAL CARE IF:   Your pain is severe.  You notice weakness in your arms,  hands, or legs.  You begin to lose control of your bladder or bowel movements. MAKE SURE YOU:   Understand these instructions.  Will watch your condition.  Will get help right away if you are not doing well or get worse. Document Released: 12/21/2006 Document Revised: 05/18/2011 Document Reviewed: 06/27/2013 [facility] Patient Information 2015 [place], [place]. This information is not intended to replace advice given to you by your health care provider. Make sure you discuss any questions you have with your health care provider.

## 2014-10-01 NOTE — ED Provider Notes (Signed)
CSN: [id number]     Arrival date & time 10/01/14  1616 History   First MD Initiated Contact with Patient 10/01/14 1631     Chief Complaint  Patient presents with  . Shoulder Pain     (Consider location/radiation/quality/duration/timing/severity/associated sxs/prior Treatment) Patient is a 39 y.o. female presenting with shoulder pain. The history is provided by the patient.  Shoulder Pain Location:  Shoulder Time since incident:  2 weeks Injury: yes   Mechanism of injury comment:  Lifting heavy objects Shoulder location:  R shoulder Pain details:    Quality:  Aching and tingling   Severity:  Moderate   Onset quality:  Gradual   Duration:  2 weeks   Timing:  Intermittent   Progression:  Worsening Chronicity:  New Handedness:  Right-handed Dislocation: no   Relieved by:  Nothing Worsened by:  Movement Ineffective treatments:  None tried Associated symptoms: tingling   Associated symptoms: no back pain and no neck pain   Risk factors: no frequent fractures     Past Medical History  Diagnosis Date  . Migraine    Past Surgical History  Procedure Laterality Date  . Ablation    . Tubal ligation    . Foot surgery    . Hernia repair     History reviewed. No pertinent family history. History  Substance Use Topics  . Smoking status: Former Games developer  . Smokeless tobacco: Not on file  . Alcohol Use: No   OB History    No data available     Review of Systems  Constitutional: Negative for activity change.       All ROS Neg except as noted in HPI  HENT: Negative for nosebleeds.   Eyes: Negative for photophobia and discharge.  Respiratory: Negative for cough, shortness of breath and wheezing.   Cardiovascular: Negative for chest pain and palpitations.  Gastrointestinal: Negative for abdominal pain and blood in stool.  Genitourinary: Negative for dysuria, frequency and hematuria.  Musculoskeletal: Negative for back pain, arthralgias and neck pain.  Skin: Negative.     Neurological: Positive for headaches. Negative for dizziness, seizures and speech difficulty.  Psychiatric/Behavioral: Negative for hallucinations and confusion.      Allergies  Review of patient's allergies indicates no known allergies.  Home Medications   Prior to Admission medications   Not on File   BP 113/74 mmHg  Pulse 68  Temp(Src) 98 F (36.7 C) (Oral)  Resp 14  Ht  (1.626 m)  Wt 150 lb (68.04 kg)  BMI 25.73 kg/m2  SpO2 100% Physical Exam  Constitutional: She is oriented to person, place, and time. She appears well-developed and well-nourished.  Non-toxic appearance.  HENT:  Head: Normocephalic.  Right Ear: Tympanic membrane and external ear normal.  Left Ear: Tympanic membrane and external ear normal.  Eyes: EOM and lids are normal. Pupils are equal, round, and reactive to light.  Neck: Normal range of motion. Neck supple. Carotid bruit is not present.  Cardiovascular: Normal rate, regular rhythm, normal heart sounds, intact distal pulses and normal pulses.   Pulmonary/Chest: Breath sounds normal. No respiratory distress.  Abdominal: Soft. Bowel sounds are normal. There is no tenderness. There is no guarding.  Musculoskeletal: Normal range of motion. She exhibits tenderness.       Back:  Tingling in fingers worse with hyperextention of the right wrist. Non-specific Tinel's sign.  Lymphadenopathy:       Head (right side): No submandibular adenopathy present.       Head (  left side): No submandibular adenopathy present.    She has no cervical adenopathy.  Neurological: She is alert and oriented to person, place, and time. She has normal strength. No cranial nerve deficit or sensory deficit.  Grip symmetrical. No muscle atrophy noted.  Skin: Skin is warm and dry.  Psychiatric: She has a normal mood and affect. Her speech is normal.  Nursing note and vitals reviewed.   ED Course  Procedures (including critical care time) Labs Review Labs Reviewed - No  data to display  Imaging Review No results found.   EKG Interpretation None      MDM  Xray of the c spine reveals DDD. Pt advised to see Dr Luiz Blare for evaluation of the DDD and possible carpal tunnel syndrome. Wrist splint provided. Rx for ibuprofen qid and ultram given to the patient.   Final diagnoses:  None    **I have reviewed nursing notes, vital signs, and all appropriate lab and imaging results for this patient.Ivery Quale, PA-C 10/01/14 1842  Eber Hong, MD 10/01/14 [phone]

## 2014-10-01 NOTE — ED Notes (Signed)
Pt states with right shoulder pain 2 weeks ago after moving furniture, pain continued and now has tingling to fingers of right hand

## 2014-10-18 ENCOUNTER — Other Ambulatory Visit (HOSPITAL_COMMUNITY): Payer: Self-pay | Admitting: Orthopedic Surgery

## 2014-10-18 DIAGNOSIS — M542 Cervicalgia: Secondary | ICD-10-CM

## 2014-10-29 ENCOUNTER — Ambulatory Visit (HOSPITAL_COMMUNITY)
Admission: RE | Admit: 2014-10-29 | Discharge: 2014-10-29 | Disposition: A | Discharge status: Home / Self Care | Payer: Self-pay | Source: Ambulatory Visit | Attending: Orthopedic Surgery | Admitting: Orthopedic Surgery

## 2014-10-29 DIAGNOSIS — M5031 Other cervical disc degeneration,  high cervical region: Secondary | ICD-10-CM | POA: Insufficient documentation

## 2014-10-29 DIAGNOSIS — J323 Chronic sphenoidal sinusitis: Secondary | ICD-10-CM | POA: Insufficient documentation

## 2014-10-29 DIAGNOSIS — M542 Cervicalgia: Secondary | ICD-10-CM

## 2014-10-29 DIAGNOSIS — M47892 Other spondylosis, cervical region: Secondary | ICD-10-CM | POA: Insufficient documentation

## 2016-02-05 ENCOUNTER — Encounter (HOSPITAL_COMMUNITY): Payer: Self-pay | Admitting: *Deleted

## 2016-02-05 ENCOUNTER — Emergency Department (HOSPITAL_COMMUNITY)
Admission: EM | Admit: 2016-02-05 | Discharge: 2016-02-06 | Disposition: A | Discharge status: Home / Self Care | Payer: Medicaid Other | Attending: Emergency Medicine | Admitting: Emergency Medicine

## 2016-02-05 DIAGNOSIS — Z87891 Personal history of nicotine dependence: Secondary | ICD-10-CM | POA: Diagnosis not present

## 2016-02-05 DIAGNOSIS — M545 Low back pain, unspecified: Secondary | ICD-10-CM

## 2016-02-05 DIAGNOSIS — R05 Cough: Secondary | ICD-10-CM | POA: Diagnosis not present

## 2016-02-05 DIAGNOSIS — R059 Cough, unspecified: Secondary | ICD-10-CM

## 2016-02-05 DIAGNOSIS — Z791 Long term (current) use of non-steroidal anti-inflammatories (NSAID): Secondary | ICD-10-CM | POA: Insufficient documentation

## 2016-02-05 LAB — URINALYSIS, ROUTINE W REFLEX MICROSCOPIC
Bilirubin Urine: NEGATIVE
Glucose, UA: NEGATIVE mg/dL
Ketones, ur: NEGATIVE mg/dL
Leukocytes, UA: NEGATIVE
Nitrite: NEGATIVE
Protein, ur: NEGATIVE mg/dL
Specific Gravity, Urine: 1.025 (ref 1.005–1.030)
pH: 6 (ref 5.0–8.0)

## 2016-02-05 LAB — PREGNANCY, URINE: Preg Test, Ur: NEGATIVE

## 2016-02-05 LAB — URINE MICROSCOPIC-ADD ON

## 2016-02-05 MED ORDER — KETOROLAC TROMETHAMINE 60 MG/2ML IM SOLN
60.0000 mg | Freq: Once | INTRAMUSCULAR | Status: AC
  Administered 2016-02-06: 60 mg via INTRAMUSCULAR
  Filled 2016-02-05: qty 2

## 2016-02-05 MED ORDER — BENZONATATE 100 MG PO CAPS
200.0000 mg | ORAL_CAPSULE | Freq: Once | ORAL | Status: AC
Start: 2016-02-06 — End: 2016-02-06
  Administered 2016-02-06: 200 mg via ORAL
  Filled 2016-02-05: qty 2

## 2016-02-05 NOTE — ED Notes (Signed)
Pt updated on delay,  

## 2016-02-05 NOTE — ED Notes (Addendum)
Pt reports R flank pain, denies dysuria, N/V/D/, or fever. States she has had pain like this before when she had a "kidney infection". CVA tenderness noted to R flank.

## 2016-02-05 NOTE — ED Notes (Signed)
ED Provider at bedside. 

## 2016-02-05 NOTE — ED Triage Notes (Signed)
Pt c/o right side flank pain that has gotten worse over the last couple of weeks; pt denies any urinary sx

## 2016-02-06 ENCOUNTER — Emergency Department (HOSPITAL_COMMUNITY): Payer: Medicaid Other

## 2016-02-06 MED ORDER — HYDROCODONE-ACETAMINOPHEN 5-325 MG PO TABS: 1.0000 | ORAL_TABLET | ORAL | 0 refills | Status: DC | PRN

## 2016-02-06 MED ORDER — BENZONATATE 100 MG PO CAPS: 200.0000 mg | ORAL_CAPSULE | Freq: Three times a day (TID) | ORAL | 0 refills | Status: DC | PRN

## 2016-02-06 NOTE — Discharge Instructions (Signed)
Your xrays and lab tests are stable today with no sign of a serious source of your pain.  I suspect you have strained muscles from your heavy coughing.  Use the medicines prescribed to help with cough and with pain (pain medicine will also help to reduce the need to cough).

## 2016-02-06 NOTE — ED Notes (Signed)
Patient transported to X-ray 

## 2016-02-06 NOTE — ED Notes (Signed)
Pt updated on plan of care,  

## 2016-02-06 NOTE — ED Notes (Signed)
Pt returned from xray,  

## 2016-02-06 NOTE — ED Provider Notes (Signed)
[facility]-EMERGENCY DEPT Provider Note   CSN: [id number] Arrival date & time: 02/05/16  1803     History   Chief Complaint Chief Complaint  Patient presents with  . Flank Pain    HPI Sara Sweeney is a 40 y.o. female with past medical history as outlined below presenting with right lower back pain which has been present for the past week, but severe today and worsened with movement and palpation.  She had a uri several weeks ago but has persistent sometimes severe coughing which she states may be the source of her pain.  She denies shortness of breath, fevers, chills.  The pain is similar to pain experienced with a prior kidney infection, but she denies dysuria, hematuria, increased frequency or urgency.  She denies personal or family history of kidney stone.  Patient denies any injury to her back.  There is no radiation of pain into either lower extremity.  There has been no weakness or numbness in the lower extremities and no urinary or bowel retention or incontinence.  Patient does not have a history of cancer or IVDU.  The patient has tried ibuprofen without significant relief of symptoms.   The history is provided by the patient.    Past Medical History:  Diagnosis Date  . Migraine     Patient Active Problem List   Diagnosis Date Noted  . HIATAL HERNIA 10/07/2007  . CONSTIPATION, CHRONIC 10/07/2007  . 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 NAUSEA AND VOMITING 10/07/2007  . ABDOMINAL PAIN 10/07/2007  . EPIGASTRIC PAIN 10/07/2007    Past Surgical History:  Procedure Laterality Date  . ABLATION    . FOOT SURGERY    . HERNIA REPAIR    . TUBAL LIGATION      OB History    No data available       Home Medications    Prior to Admission medications   Medication Sig Start Date End Date Taking? Authorizing Provider  benzonatate (TESSALON) 100 MG capsule Take 2 capsules (200 mg total) by mouth 3 (three) times daily as needed. 02/06/16   Burgess Amor, PA-C  HYDROcodone-acetaminophen  (NORCO/VICODIN) 5-325 MG tablet Take 1 tablet by mouth every 4 (four) hours as needed. 02/06/16   Burgess Amor, PA-C  ibuprofen (ADVIL,MOTRIN) 600 MG tablet Take 1 tablet (600 mg total) by mouth 4 (four) times daily. 10/01/14   Ivery Quale, PA-C  promethazine (PHENERGAN) 25 MG suppository Place 1 suppository (25 mg total) rectally every 6 (six) hours as needed for nausea or vomiting. 10/01/14   Ivery Quale, PA-C  traMADol Janean Sark) 50 MG tablet 1 or 2 po q6h prn pain 10/01/14   Ivery Quale, PA-C    Family History History reviewed. No pertinent family history.  Social History Social History  Substance Use Topics  . Smoking status: Former Games developer  . Smokeless tobacco: Never Used  . Alcohol use No     Allergies   Patient has no known allergies.   Review of Systems Review of Systems  Constitutional: Negative for fever.  Respiratory: Positive for cough. Negative for shortness of breath and wheezing.   Cardiovascular: Negative for chest pain and leg swelling.  Gastrointestinal: Negative for abdominal distention, abdominal pain and constipation.  Genitourinary: Negative for difficulty urinating, dysuria, flank pain, frequency and urgency.  Musculoskeletal: Positive for back pain. Negative for gait problem and joint swelling.  Skin: Negative for rash.  Neurological: Negative for weakness and numbness.     Physical Exam Updated Vital Signs BP 111/78  Pulse 85   Temp 98.2 F (36.8 C) (Oral)   Resp 18   Ht 5\' 4"  (1.626 m)   Wt 68 kg   SpO2 98%   BMI 25.75 kg/m   Physical Exam  Constitutional: She appears well-developed and well-nourished.  HENT:  Head: Normocephalic.  Eyes: Conjunctivae are normal.  Neck: Normal range of motion. Neck supple.  Cardiovascular: Normal rate and intact distal pulses.   Pedal pulses normal.  Pulmonary/Chest: Effort normal.  Abdominal: Soft. Bowel sounds are normal. She exhibits no distension and no mass.  Musculoskeletal: Normal range of  motion. She exhibits no edema.       Lumbar back: She exhibits tenderness. She exhibits no bony tenderness, no swelling, no edema, no deformity and no spasm.  Point tender right lower paralumbar soft tissue. No edema, nodules or rash.  Neurological: She is alert. She has normal strength. She displays no atrophy and no tremor. No sensory deficit. Gait normal.  Reflex Scores:      Patellar reflexes are 2+ on the right side and 2+ on the left side.      Achilles reflexes are 2+ on the right side and 2+ on the left side. No strength deficit noted in hip and knee flexor and extensor muscle groups.  Ankle flexion and extension intact.  Skin: Skin is warm and dry.  Psychiatric: She has a normal mood and affect.  Nursing note and vitals reviewed.    ED Treatments / Results  Labs (all labs ordered are listed, but only abnormal results are displayed) Labs Reviewed  URINALYSIS, ROUTINE W REFLEX MICROSCOPIC (NOT AT [facility]) - Abnormal; Notable for the following:       Result Value   Hgb urine dipstick SMALL (*)    All other components within normal limits  URINE MICROSCOPIC-ADD ON - Abnormal; Notable for the following:    Squamous Epithelial / LPF 0-5 (*)    Bacteria, UA FEW (*)    All other components within normal limits  PREGNANCY, URINE    EKG  EKG Interpretation None       Radiology Dg Chest 2 View  Result Date: 02/06/2016 CLINICAL DATA:  Subacute onset of dry cough.  Initial encounter. EXAM: CHEST  2 VIEW COMPARISON:  Chest radiograph performed 01/18/2012 FINDINGS: The lungs are well-aerated and clear. There is no evidence of focal opacification, pleural effusion or pneumothorax. The heart is normal in size; the mediastinal contour is within normal limits. No acute osseous abnormalities are seen. IMPRESSION: No acute cardiopulmonary process seen. Electronically Signed   By: Roanna RaiderJeffery  Chang M.D.   On: 02/06/2016 01:04   Dg Lumbar Spine Complete  Result Date: 02/06/2016 CLINICAL  DATA:  Acute onset of lower back pain.  Initial encounter. 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 Vertebral bodies demonstrate normal height. Intervertebral disc spaces are preserved. The visualized bowel gas pattern is unremarkable in appearance; air and stool are noted within the colon. The sacroiliac joints are within normal limits. IMPRESSION: 1. No evidence of fracture or subluxation along the lumbar spine. 2. 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 Electronically Signed   By: Roanna RaiderJeffery  Chang M.D.   On: 02/06/2016 01:03    Procedures Procedures (including critical care time)  Medications Ordered in ED Medications  ketorolac (TORADOL) injection 60  mg (60 mg Intramuscular Given 02/06/16 0013)  benzonatate (TESSALON) capsule 200 mg (200 mg Oral Given 02/06/16 0012)     Initial Impression / Assessment and Plan / ED Course  I have reviewed the triage vital signs and the nursing notes.  Pertinent labs & imaging results that were available during my care of the patient were reviewed by me and considered in my medical decision making (see chart for details).  Clinical Course     Back pain is reproducible, right paralumbar localized Pain. No cva tenderness.  No midline focused pain. Doubt  Epidural abscess. No focal deficits.  Pt was prescribed tessalon for cough, hydrocodone for back pain and extra cough suppression.    The patient appears reasonably screened and/or stabilized for discharge and I doubt any other medical condition or other [facility] requiring further screening, evaluation, or treatment in the ED at this time prior to discharge.   Final Clinical Impressions(s) / ED Diagnoses   Final diagnoses:  Cough  Acute right-sided low back pain without sciatica    New  Prescriptions New Prescriptions   BENZONATATE (TESSALON) 100 MG CAPSULE    Take 2 capsules (200 mg total) by mouth 3 (three) times daily as needed.   HYDROCODONE-ACETAMINOPHEN (NORCO/VICODIN) 5-325 MG TABLET    Take 1 tablet by mouth every 4 (four) hours as needed.     Burgess AmorJulie Kemani Heidel, PA-C 02/06/16 0123    Raeford RazorStephen Kohut, MD 02/11/16 1009

## 2016-06-14 DIAGNOSIS — S82141A Displaced bicondylar fracture of right tibia, initial encounter for closed fracture: Secondary | ICD-10-CM | POA: Insufficient documentation

## 2016-07-28 DIAGNOSIS — S52135A Nondisplaced fracture of neck of left radius, initial encounter for closed fracture: Secondary | ICD-10-CM | POA: Insufficient documentation

## 2017-02-04 ENCOUNTER — Emergency Department (HOSPITAL_COMMUNITY)
Admission: EM | Admit: 2017-02-04 | Discharge: 2017-02-04 | Disposition: A | Discharge status: Home / Self Care | Payer: Self-pay | Attending: Emergency Medicine | Admitting: Emergency Medicine

## 2017-02-04 ENCOUNTER — Other Ambulatory Visit: Payer: Self-pay

## 2017-02-04 DIAGNOSIS — J40 Bronchitis, not specified as acute or chronic: Secondary | ICD-10-CM | POA: Insufficient documentation

## 2017-02-04 DIAGNOSIS — R0981 Nasal congestion: Secondary | ICD-10-CM | POA: Insufficient documentation

## 2017-02-04 DIAGNOSIS — Z79899 Other long term (current) drug therapy: Secondary | ICD-10-CM | POA: Insufficient documentation

## 2017-02-04 DIAGNOSIS — J3489 Other specified disorders of nose and nasal sinuses: Secondary | ICD-10-CM | POA: Insufficient documentation

## 2017-02-04 DIAGNOSIS — R51 Headache: Secondary | ICD-10-CM | POA: Insufficient documentation

## 2017-02-04 MED ORDER — AZITHROMYCIN 250 MG PO TABS: 250.0000 mg | ORAL_TABLET | Freq: Every day | ORAL | 0 refills | Status: DC

## 2017-02-04 MED ORDER — ALBUTEROL SULFATE HFA 108 (90 BASE) MCG/ACT IN AERS
2.0000 | INHALATION_SPRAY | Freq: Once | RESPIRATORY_TRACT | Status: AC
  Administered 2017-02-04: 2 via RESPIRATORY_TRACT
  Filled 2017-02-04: qty 6.7

## 2017-02-04 MED ORDER — GUAIFENESIN-CODEINE 100-10 MG/5ML PO SYRP: 10.0000 mL | ORAL_SOLUTION | Freq: Three times a day (TID) | ORAL | 0 refills | Status: DC | PRN

## 2017-02-04 NOTE — ED Triage Notes (Signed)
Cough, runny nose and headache x 1 week.   

## 2017-02-04 NOTE — ED Notes (Signed)
RT aware of inhaler.  

## 2017-02-04 NOTE — ED Provider Notes (Signed)
[facility] Provider Note   CSN: [id number] Arrival date & time: 02/04/17  1309     History   Chief Complaint Chief Complaint  Patient presents with  . Cough    HPI Sara Sweeney is a 41 y.o. female.  HPI  Sara Sweeney is a 41 y.o. female who presents to the Emergency Department complaining of persistent cough, nasal congestion rhinorrhea and frontal headache.  Symptoms present for 1 week.  Cough is worse at night and with lying down.  She states the cough is mostly been nonproductive.  No known fever.  She has been taking over-the-counter cough and cold medication without relief.  She also describes intermittent chest tightness associated with coughing.  She denies wheezing, hemoptysis, chest pain, neck pain or stiffness visual changes.  She states that she gets similar symptoms during the fall every year.  Past Medical History:  Diagnosis Date  . Migraine     Patient Active Problem List   Diagnosis Date Noted  . HIATAL HERNIA 10/07/2007  . CONSTIPATION, CHRONIC 10/07/2007  . 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 NAUSEA AND VOMITING 10/07/2007  . ABDOMINAL PAIN 10/07/2007  . EPIGASTRIC PAIN 10/07/2007    Past Surgical History:  Procedure Laterality Date  . ABLATION    . FOOT SURGERY    . HERNIA REPAIR    . TUBAL LIGATION      OB History    No data available       Home Medications    Prior to Admission medications   Medication Sig Start Date End Date Taking? Authorizing Provider  azithromycin (ZITHROMAX) 250 MG tablet Take 1 tablet (250 mg total) by mouth daily. Take first 2 tablets together, then 1 every day until finished. 02/04/17   Helmut Hennon, PA-C  benzonatate (TESSALON) 100 MG capsule Take 2 capsules (200 mg total) by mouth 3 (three) times daily as needed. 02/06/16   Burgess AmorIdol, Julie, PA-C  guaiFENesin-codeine (ROBITUSSIN AC) 100-10 MG/5ML syrup Take 10 mLs by mouth 3 (three) times daily as needed. 02/04/17   Javarri Segal, PA-C    HYDROcodone-acetaminophen (NORCO/VICODIN) 5-325 MG tablet Take 1 tablet by mouth every 4 (four) hours as needed. 02/06/16   Burgess AmorIdol, Julie, PA-C  ibuprofen (ADVIL,MOTRIN) 600 MG tablet Take 1 tablet (600 mg total) by mouth 4 (four) times daily. 10/01/14   Ivery QualeBryant, Hobson, PA-C  promethazine (PHENERGAN) 25 MG suppository Place 1 suppository (25 mg total) rectally every 6 (six) hours as needed for nausea or vomiting. 10/01/14   Ivery QualeBryant, Hobson, PA-C  traMADol Janean Sark(ULTRAM) 50 MG tablet 1 or 2 po q6h prn pain 10/01/14   Ivery QualeBryant, Hobson, PA-C    Family History No family history on file.  Social History Social History   Tobacco Use  . Smoking status: Former Games developermoker  . Smokeless tobacco: Never Used  Substance Use Topics  . Alcohol use: No  . Drug use: No     Allergies   Patient has no known allergies.   Review of Systems Review of Systems  Constitutional: Negative for appetite change, chills and fever.  HENT: Positive for congestion, rhinorrhea and sore throat. Negative for trouble swallowing.   Eyes: Negative for visual disturbance.  Respiratory: Positive for cough and chest tightness. Negative for shortness of breath and wheezing.   Cardiovascular: Negative for chest pain.  Gastrointestinal: Negative for abdominal pain, nausea and vomiting.  Genitourinary: Negative for dysuria.  Musculoskeletal: Negative for arthralgias.  Skin: Negative for rash.  Neurological: Positive for headaches. Negative for  dizziness, weakness and numbness.  Hematological: Negative for adenopathy.  All other systems reviewed and are negative.    Physical Exam Updated Vital Signs BP 123/74 (BP Location: Right Arm)   Pulse 75   Temp 98.4 F (36.9 C) (Oral)   Resp 18   Ht 5\' 4"  (1.626 m)   Wt 75.8 kg (167 lb)   LMP 02/04/2017 Comment: ablasion  SpO2 98%   BMI 28.67 kg/m   Physical Exam  Constitutional: She is oriented to person, place, and time. She appears well-developed and well-nourished. No distress.   HENT:  Head: Normocephalic and atraumatic.  Right Ear: Tympanic membrane and ear canal normal.  Left Ear: Tympanic membrane and ear canal normal.  Mouth/Throat: Uvula is midline, oropharynx is clear and moist and mucous membranes are normal. No oropharyngeal exudate.  Eyes: EOM are normal. Pupils are equal, round, and reactive to light.  Neck: Normal range of motion, full passive range of motion without pain and phonation normal. Neck supple. No Kernig's sign noted.  Cardiovascular: Normal rate, regular rhythm and intact distal pulses.  No murmur heard. Pulmonary/Chest: Effort normal. No stridor. No respiratory distress. She has no rales. She exhibits no tenderness.  Coarse lungs sounds bilaterally, no rales or wheezing.  Abdominal: Soft. She exhibits no distension. There is no tenderness. There is no guarding.  Musculoskeletal: Normal range of motion. She exhibits no edema.  Lymphadenopathy:    She has no cervical adenopathy.  Neurological: She is alert and oriented to person, place, and time. No sensory deficit. She exhibits normal muscle tone. Coordination normal.  Skin: Skin is warm and dry. Capillary refill takes less than 2 seconds.  Psychiatric: She has a normal mood and affect.  Nursing note and vitals reviewed.    ED Treatments / Results  Labs (all labs ordered are listed, but only abnormal results are displayed) Labs Reviewed - No data to display  EKG  EKG Interpretation None       Radiology No results found.  Procedures Procedures (including critical care time)  Medications Ordered in ED Medications  albuterol (PROVENTIL HFA;VENTOLIN HFA) 108 (90 Base) MCG/ACT inhaler 2 puff (2 puffs Inhalation Given 02/04/17 1440)     Initial Impression / Assessment and Plan / ED Course  I have reviewed the triage vital signs and the nursing notes.  Pertinent labs & imaging results that were available during my care of the patient were reviewed by me and considered in my  medical decision making (see chart for details).     Patient nontoxic-appearing.  No meningeal signs. vital signs are stable.  No tachycardia, tachypnea or hypoxia.  Albuterol MDI dispensed, she appears stable for discharge, agrees to PCP follow-up or ER return if not improving or worsen  Final Clinical Impressions(s) / ED Diagnoses   Final diagnoses:  Bronchitis    ED Discharge Orders        Ordered    guaiFENesin-codeine [facility](ROBITUSSIN AC) 100-10 MG/5ML syrup  3 times daily PRN     02/04/17 1435    azithromycin (ZITHROMAX) 250 MG tablet  Daily     02/04/17 1435       Pauline Ausriplett, Emmauel Hallums, PA-C 02/04/17 1551    Linwood DibblesKnapp, Jon, MD 02/06/17 [phone]

## 2017-02-04 NOTE — Discharge Instructions (Signed)
Drink plenty of water.  2 puffs of the inhaler 4 times a day as needed.  Follow-up with your doctor for recheck if needed.

## 2018-10-01 IMAGING — DX DG CHEST 2V
2 series · 2 of 2 positions shown · non-contrast
Comparison: Chest radiograph performed 01/18/2012

CLINICAL DATA: Subacute onset of dry cough.  Initial encounter.

EXAM:
CHEST  2 VIEW

[chest pa]
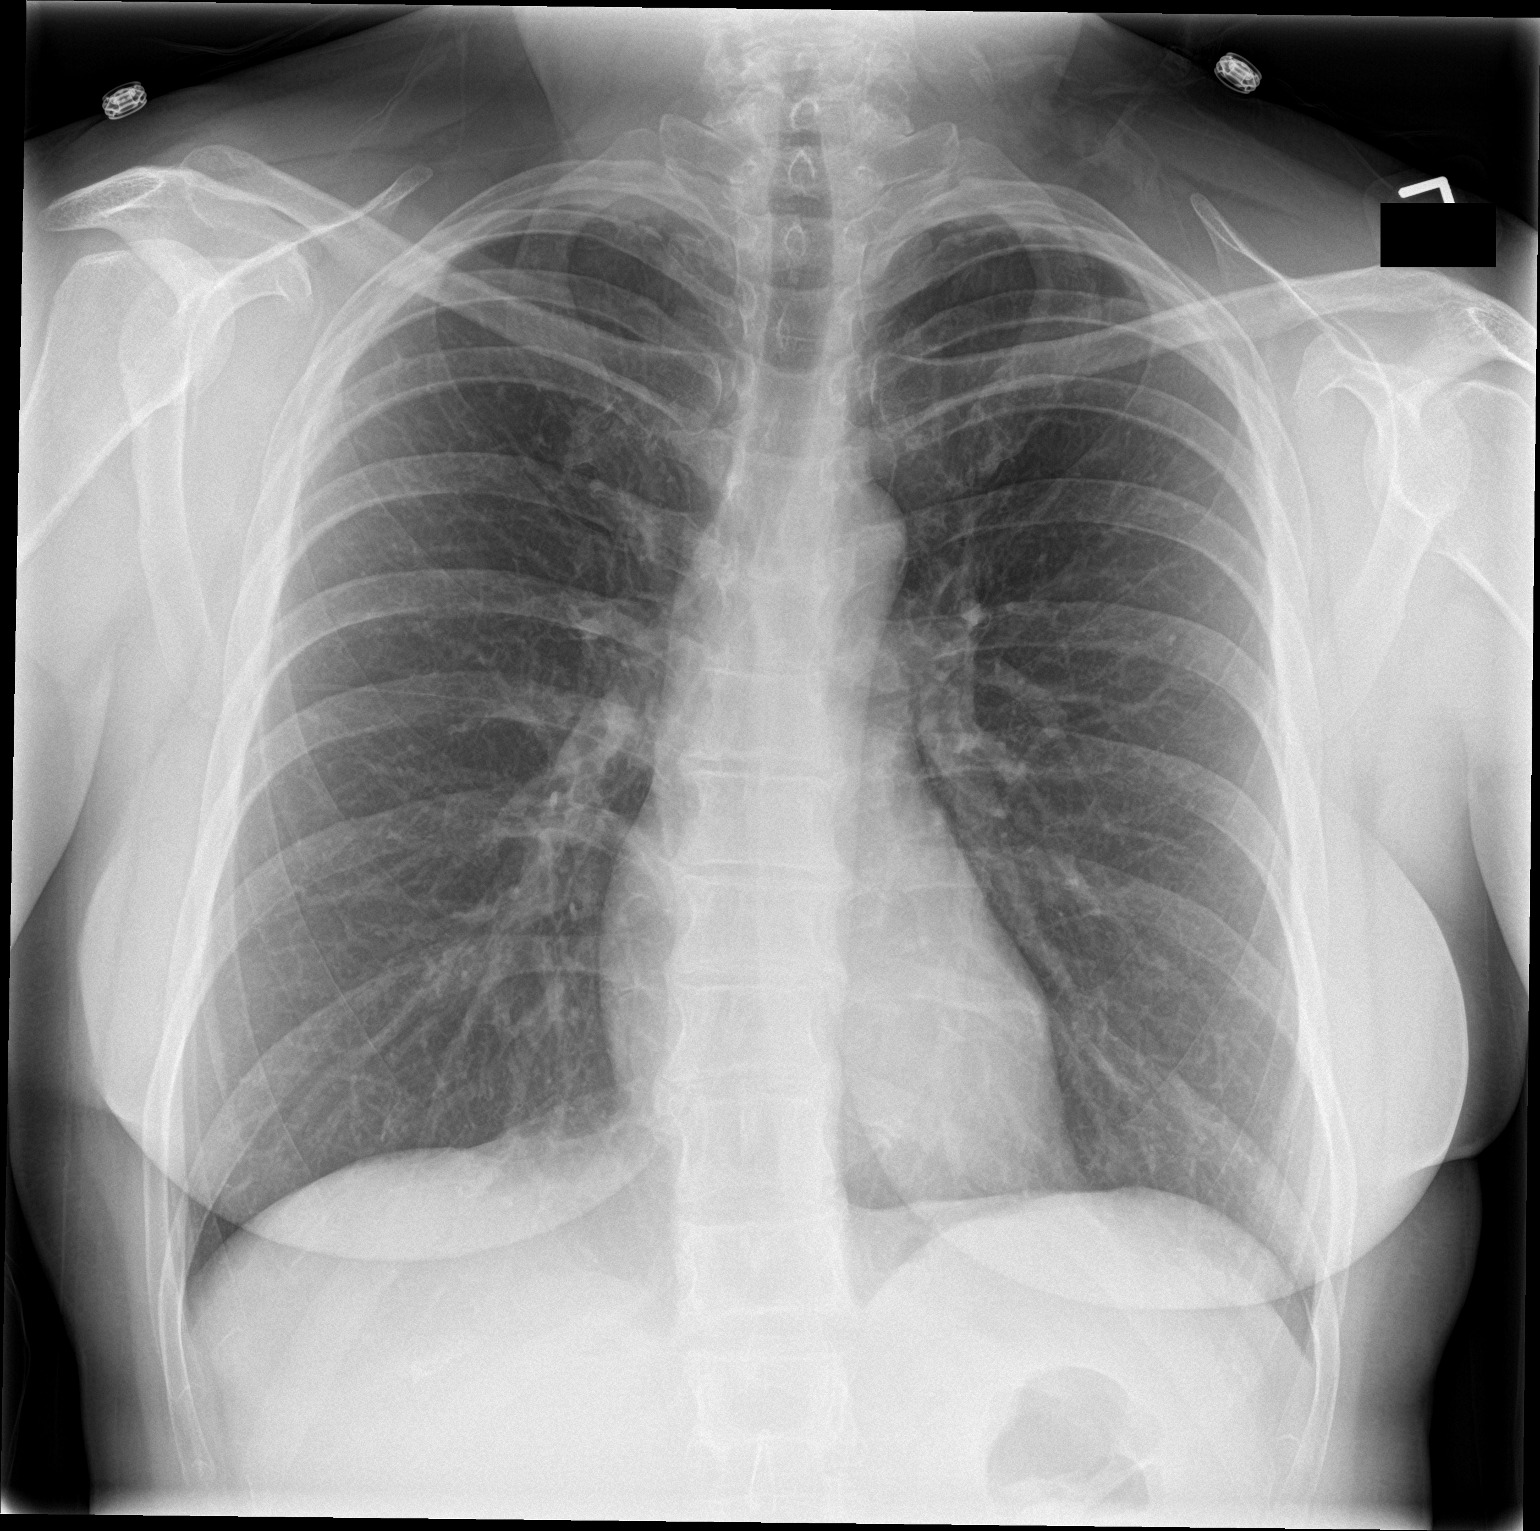

[chest lat]
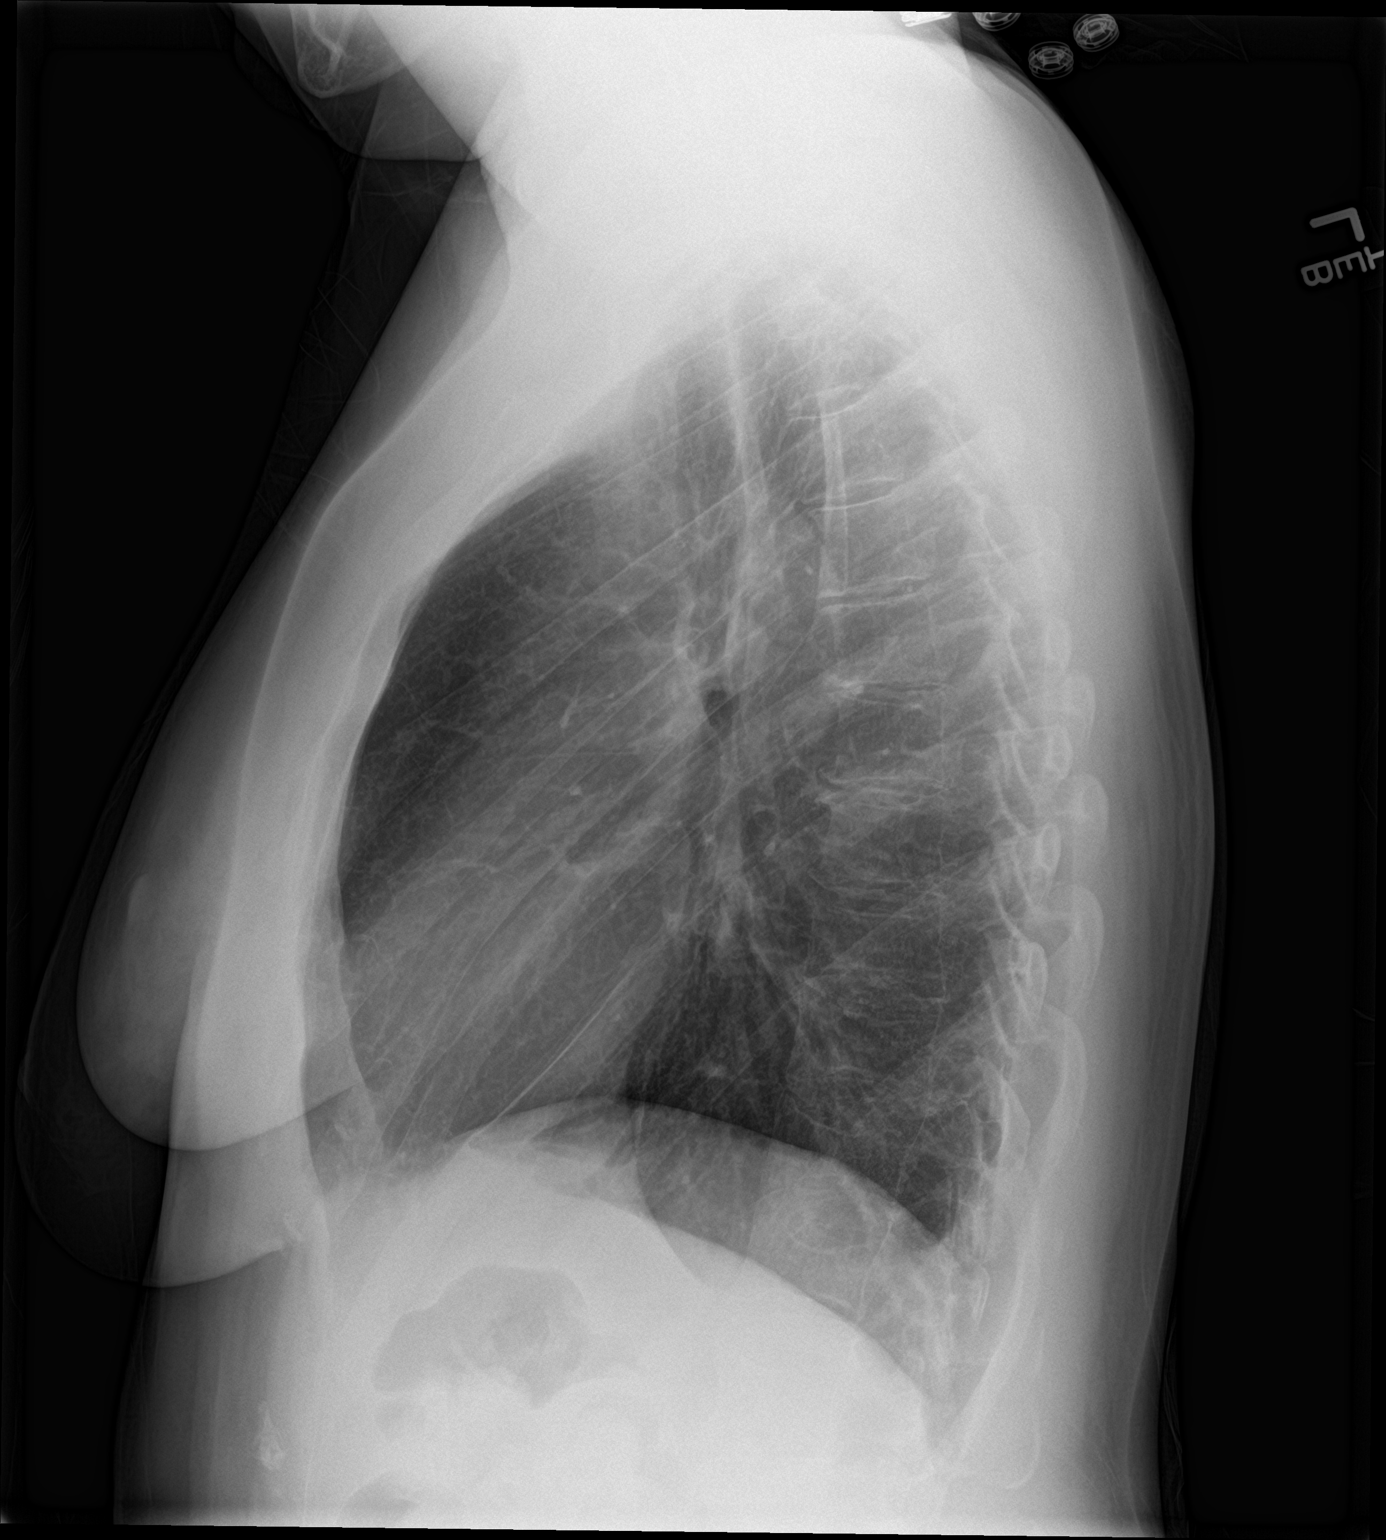

[2 of 2 positions shown; findings below may reference images not displayed]

FINDINGS: The lungs are well-aerated and clear. There is no evidence of focal
opacification, pleural effusion or pneumothorax.

The heart is normal in size; the mediastinal contour is within
normal limits. No acute osseous abnormalities are seen.
IMPRESSION: No acute cardiopulmonary process seen.

## 2018-10-01 IMAGING — DX DG LUMBAR SPINE COMPLETE 4+V
5 series · 5 of 5 positions shown · non-contrast
Comparison: CT of the abdomen and pelvis performed 06/08/2013

CLINICAL DATA: Acute onset of lower back pain.  Initial encounter.

EXAM:
LUMBAR SPINE - COMPLETE 4+ VIEW

[l-spine ap]
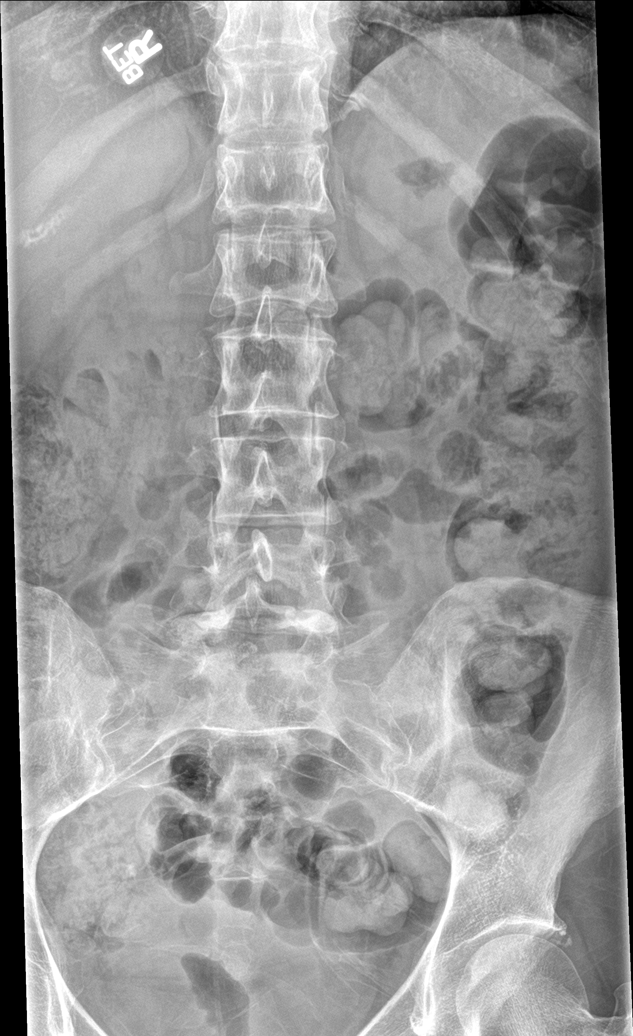

[l-spine obl (1 of 2)]
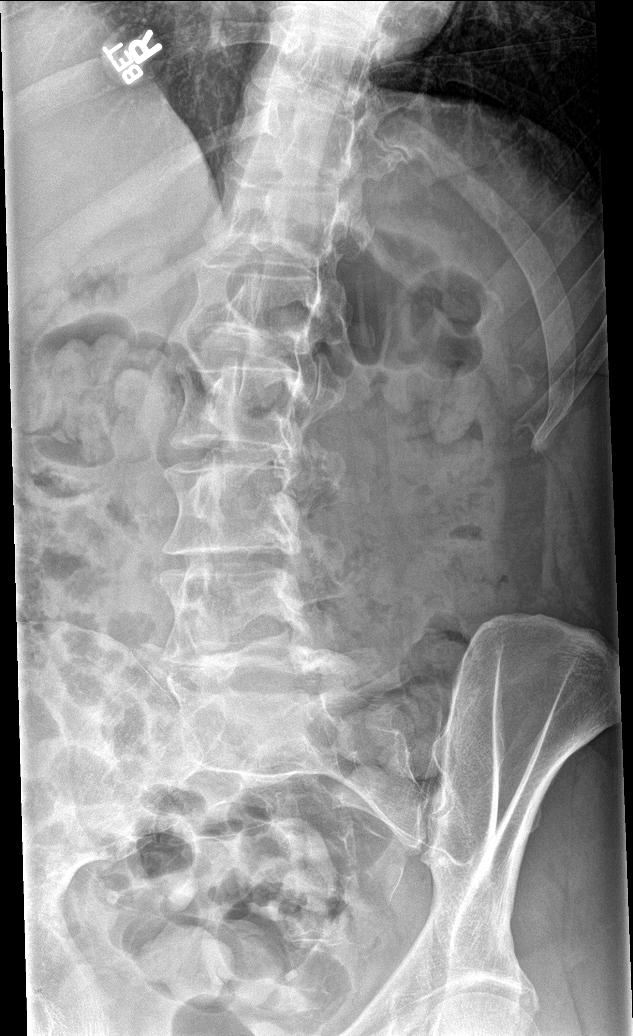

[l-spine obl (2 of 2)]
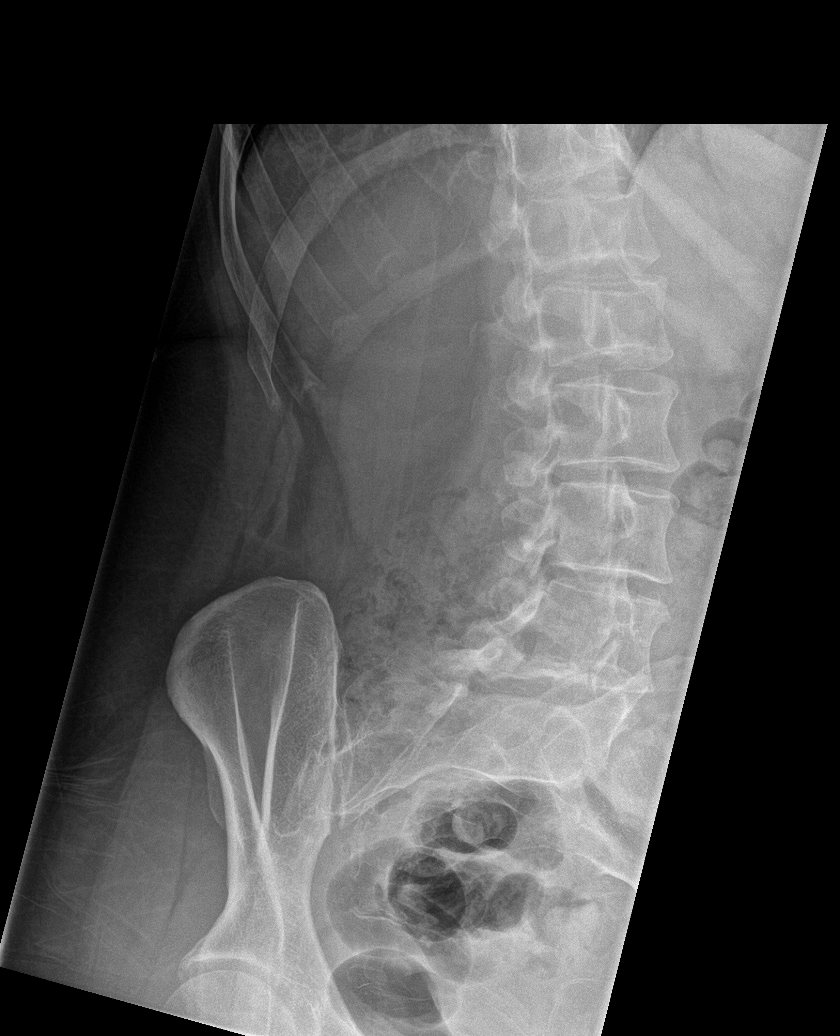

[l-spine lat]
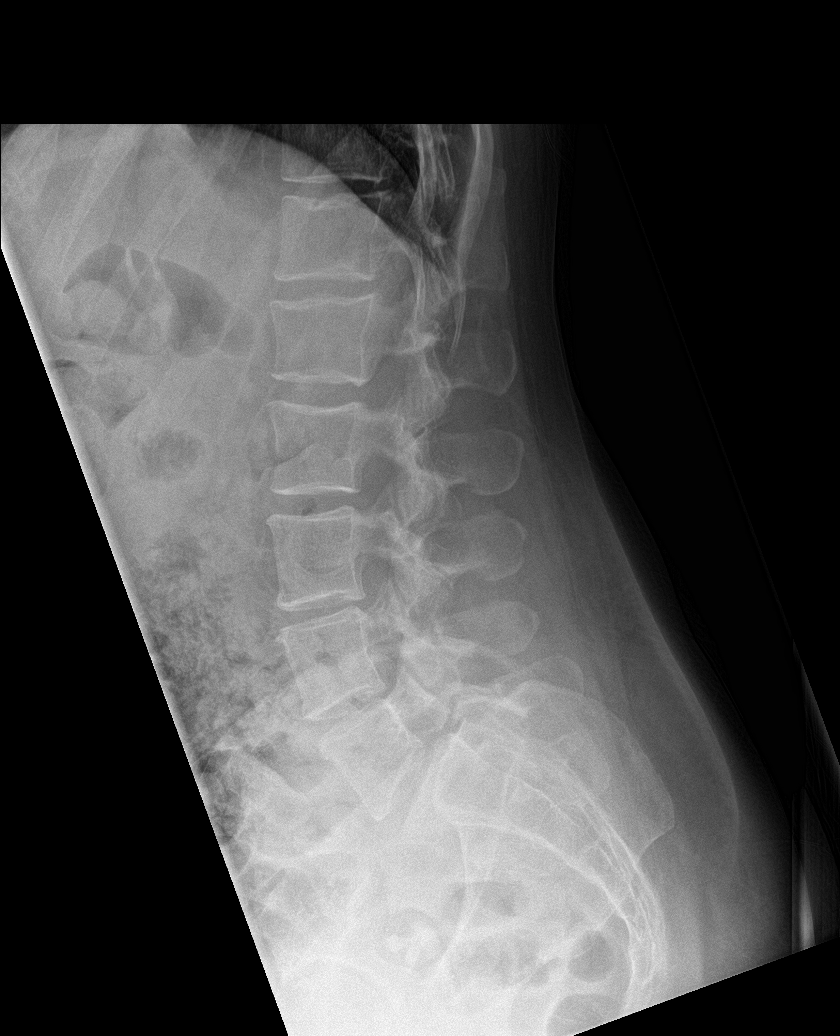

[l-spine spot]
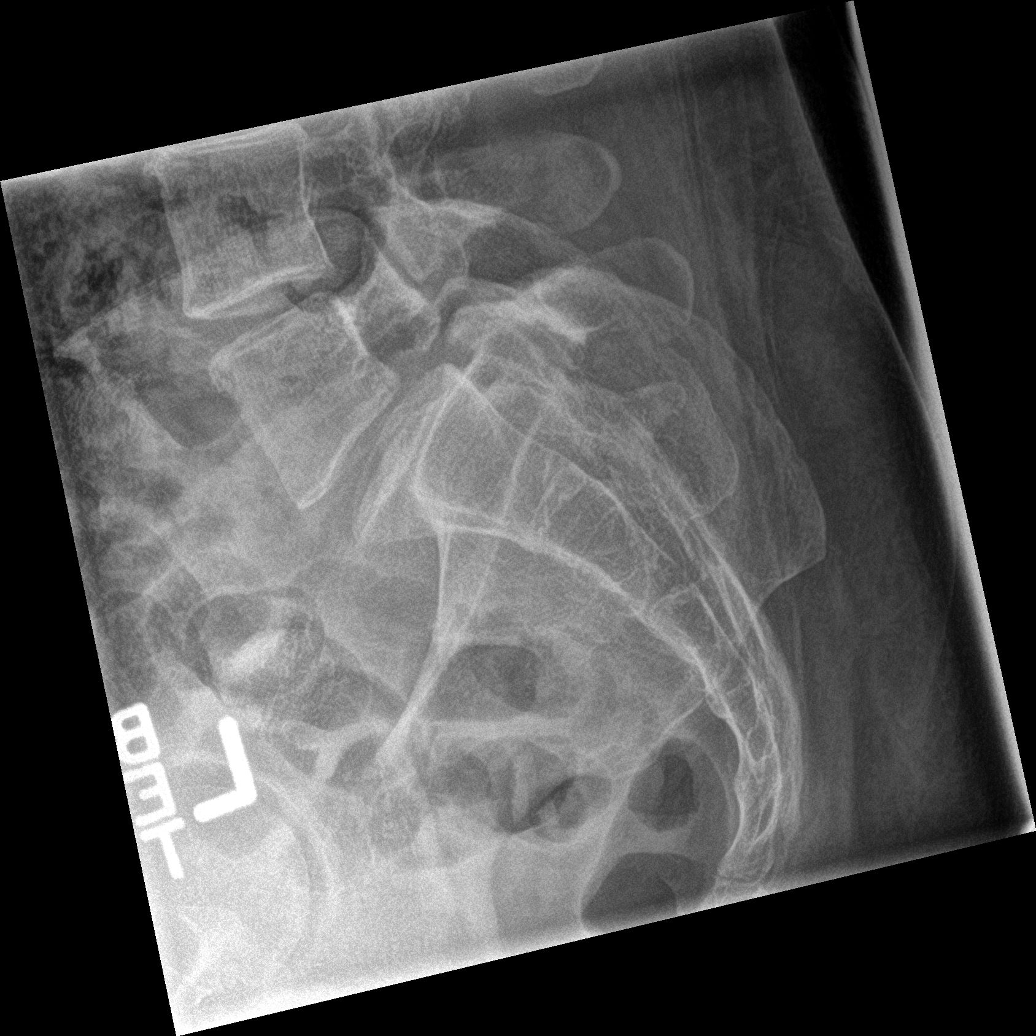

[5 of 5 positions shown; findings below may reference images not displayed]

FINDINGS: There is grade 2 anterolisthesis of L5 on S1, reflecting chronic
bilateral pars defects at L5. Vertebral bodies demonstrate normal
height. Intervertebral disc spaces are preserved.

The visualized bowel gas pattern is unremarkable in appearance; air
and stool are noted within the colon. The sacroiliac joints are
within normal limits.
IMPRESSION: 1. No evidence of fracture or subluxation along the lumbar spine.
2. Grade 2 anterolisthesis of L5 on S1, reflecting chronic bilateral
pars defects at L5.

## 2019-12-27 DIAGNOSIS — Z6826 Body mass index (BMI) 26.0-26.9, adult: Secondary | ICD-10-CM | POA: Diagnosis not present

## 2019-12-27 DIAGNOSIS — R0981 Nasal congestion: Secondary | ICD-10-CM | POA: Diagnosis not present

## 2019-12-27 DIAGNOSIS — G44219 Episodic tension-type headache, not intractable: Secondary | ICD-10-CM | POA: Diagnosis not present

## 2019-12-27 DIAGNOSIS — J00 Acute nasopharyngitis [common cold]: Secondary | ICD-10-CM | POA: Diagnosis not present

## 2020-07-26 DIAGNOSIS — J00 Acute nasopharyngitis [common cold]: Secondary | ICD-10-CM | POA: Diagnosis not present

## 2020-10-20 DIAGNOSIS — Z20822 Contact with and (suspected) exposure to covid-19: Secondary | ICD-10-CM | POA: Diagnosis not present

## 2021-01-17 ENCOUNTER — Encounter: Payer: Self-pay | Admitting: Family Medicine

## 2021-01-17 ENCOUNTER — Ambulatory Visit (INDEPENDENT_AMBULATORY_CARE_PROVIDER_SITE_OTHER): Payer: BC Managed Care – PPO | Admitting: Family Medicine

## 2021-01-17 ENCOUNTER — Other Ambulatory Visit: Payer: Self-pay

## 2021-01-17 VITALS — BP 119/80 | HR 78 | Temp 97.7°F | Ht 63.0 in | Wt 153.0 lb

## 2021-01-17 DIAGNOSIS — R053 Chronic cough: Secondary | ICD-10-CM | POA: Diagnosis not present

## 2021-01-17 DIAGNOSIS — Z23 Encounter for immunization: Secondary | ICD-10-CM

## 2021-01-17 DIAGNOSIS — J301 Allergic rhinitis due to pollen: Secondary | ICD-10-CM

## 2021-01-17 DIAGNOSIS — Z6827 Body mass index (BMI) 27.0-27.9, adult: Secondary | ICD-10-CM

## 2021-01-17 DIAGNOSIS — K219 Gastro-esophageal reflux disease without esophagitis: Secondary | ICD-10-CM | POA: Diagnosis not present

## 2021-01-17 DIAGNOSIS — J0140 Acute pansinusitis, unspecified: Secondary | ICD-10-CM

## 2021-01-17 DIAGNOSIS — Z1211 Encounter for screening for malignant neoplasm of colon: Secondary | ICD-10-CM

## 2021-01-17 DIAGNOSIS — Z1212 Encounter for screening for malignant neoplasm of rectum: Secondary | ICD-10-CM

## 2021-01-17 DIAGNOSIS — Z8619 Personal history of other infectious and parasitic diseases: Secondary | ICD-10-CM

## 2021-01-17 DIAGNOSIS — E559 Vitamin D deficiency, unspecified: Secondary | ICD-10-CM

## 2021-01-17 MED ORDER — FLUCONAZOLE 150 MG PO TABS
150.0000 mg | ORAL_TABLET | Freq: Once | ORAL | 0 refills | Status: AC
Start: 2021-01-17 — End: 2021-01-17

## 2021-01-17 MED ORDER — FEXOFENADINE HCL 180 MG PO TABS: 180.0000 mg | ORAL_TABLET | Freq: Every day | ORAL | 2 refills | Status: DC

## 2021-01-17 MED ORDER — AMOXICILLIN-POT CLAVULANATE 875-125 MG PO TABS: 1.0000 | ORAL_TABLET | Freq: Two times a day (BID) | ORAL | 0 refills | Status: AC

## 2021-01-17 MED ORDER — MONTELUKAST SODIUM 10 MG PO TABS: 10.0000 mg | ORAL_TABLET | Freq: Every day | ORAL | 5 refills | Status: DC

## 2021-01-17 MED ORDER — ESOMEPRAZOLE MAGNESIUM 40 MG PO CPDR: 40.0000 mg | DELAYED_RELEASE_CAPSULE | Freq: Every day | ORAL | 2 refills | Status: DC

## 2021-01-17 NOTE — Progress Notes (Signed)
Subjective:  Patient ID: Sara Sweeney, female    DOB: Nov 22, 1975, 45 y.o.   MRN: [id number]  Patient Care Team: Baruch Gouty, FNP as PCP - General (Family Medicine)   Chief Complaint:  New Patient (Initial Visit) (Throat problems)   HPI: Sara Sweeney is a 45 y.o. female presenting on 01/17/2021 for New Patient (Initial Visit) (Throat problems)  Presents today to establish care with new PCP.  She has not been seen by her primary care provider in several years.  She does get her PAP and mammogram screenings at [facility], records have been requested.  She has a history of vitamin D deficiency, allergic rhinitis, hiatal hernia, and GERD.  Her biggest concern today is ongoing/chronic cough.  This started about 2 years ago.  She reports she constantly has postnasal drip and feels need to clear her throat.  She has been taking Nexium and Allegra for the past 2 months without relief of symptoms.  She denies any weight loss, sore throat, dysphagia, voice changes, hemoptysis, abdominal pain, melena, or hematochezia.  She has not had colonoscopy for colorectal cancer screening.  She reports the last 2 weeks she has had significant sinus pressure, headache, and congestion.  She has been using Flonase and taking her Allegra without relief of symptoms.  Relevant past medical, surgical, family, and social history reviewed and updated as indicated.  Allergies and medications reviewed and updated. Data reviewed: Chart in Epic.   Past Medical History:  Diagnosis Date   Allergy    Anxiety    Migraine     Past Surgical History:  Procedure Laterality Date   ABLATION     FOOT SURGERY     FRACTURE SURGERY     HERNIA REPAIR     JOINT REPLACEMENT     TUBAL LIGATION      Social History   Socioeconomic History   Marital status: Not on file    Spouse name: Not on file   Number of children: Not on file   Years of education: Not on file   Highest education level: Not on  file  Occupational History   Not on file  Tobacco Use   Smoking status: Former   Smokeless tobacco: Never  Substance and Sexual Activity   Alcohol use: No   Drug use: No   Sexual activity: Yes    Birth control/protection: Surgical  Other Topics Concern   Not on file  Social History Narrative   Not on file   Social Determinants of Health   Financial Resource Strain: Not on file  Food Insecurity: Not on file  Transportation Needs: Not on file  Physical Activity: Not on file  Stress: Not on file  Social Connections: Not on file  Intimate Partner Violence: Not on file    Outpatient Encounter Medications as of 01/17/2021  Medication Sig   amoxicillin-clavulanate (AUGMENTIN) 875-125 MG tablet Take 1 tablet by mouth 2 (two) times daily for 10 days.   fluconazole (DIFLUCAN) 150 MG tablet Take 1 tablet (150 mg total) by mouth once for 1 dose.   ibuprofen (ADVIL,MOTRIN) 600 MG tablet Take 1 tablet (600 mg total) by mouth 4 (four) times daily.   montelukast (SINGULAIR) 10 MG tablet Take 1 tablet (10 mg total) by mouth at bedtime.   [DISCONTINUED] esomeprazole (NEXIUM) 40 MG capsule Take 40 mg by mouth daily at 12 noon.   [DISCONTINUED] fexofenadine (ALLEGRA) 180 MG tablet Take 180 mg by mouth daily.  esomeprazole (NEXIUM) 40 MG capsule Take 1 capsule (40 mg total) by mouth daily at 12 noon.   fexofenadine (ALLEGRA) 180 MG tablet Take 1 tablet (180 mg total) by mouth daily.   [DISCONTINUED] azithromycin (ZITHROMAX) 250 MG tablet Take 1 tablet (250 mg total) by mouth daily. Take first 2 tablets together, then 1 every day until finished.   [DISCONTINUED] benzonatate (TESSALON) 100 MG capsule Take 2 capsules (200 mg total) by mouth 3 (three) times daily as needed.   [DISCONTINUED] guaiFENesin-codeine (ROBITUSSIN AC) 100-10 MG/5ML syrup Take 10 mLs by mouth 3 (three) times daily as needed.   [DISCONTINUED] HYDROcodone-acetaminophen (NORCO/VICODIN) 5-325 MG tablet Take 1 tablet by mouth every  4 (four) hours as needed.   [DISCONTINUED] promethazine (PHENERGAN) 25 MG suppository Place 1 suppository (25 mg total) rectally every 6 (six) hours as needed for nausea or vomiting.   [DISCONTINUED] traMADol (ULTRAM) 50 MG tablet 1 or 2 po q6h prn pain   No facility-administered encounter medications on file as of 01/17/2021.    No Known Allergies  Review of Systems  Constitutional:  Negative for activity change, appetite change, chills, diaphoresis, fatigue, fever and unexpected weight change.  HENT:  Positive for congestion, postnasal drip, rhinorrhea, sinus pressure and sinus pain. Negative for dental problem, drooling, ear discharge, ear pain, facial swelling, hearing loss, mouth sores, nosebleeds, sneezing, sore throat, tinnitus, trouble swallowing and voice change.   Eyes: Negative.   Respiratory:  Positive for cough. Negative for chest tightness and shortness of breath.   Cardiovascular:  Negative for chest pain, palpitations and leg swelling.  Gastrointestinal:  Negative for abdominal pain, blood in stool, constipation, diarrhea, nausea and vomiting.  Endocrine: Negative.   Genitourinary:  Negative for decreased urine volume, difficulty urinating, dysuria, frequency and urgency.  Musculoskeletal:  Negative for arthralgias and myalgias.  Skin: Negative.   Allergic/Immunologic: Negative.   Neurological:  Negative for dizziness, tremors, seizures, syncope, facial asymmetry, speech difficulty, weakness, light-headedness, numbness and headaches.  Hematological: Negative.   Psychiatric/Behavioral:  Negative for confusion, hallucinations, sleep disturbance and suicidal ideas.   All other systems reviewed and are negative.      Objective:  BP 119/80   Pulse 78   Temp 97.7 F (36.5 C)   Ht $R'5\' 3"'dX$  (1.6 m)   Wt 153 lb (69.4 kg)   SpO2 100%   BMI 27.10 kg/m    Wt Readings from Last 3 Encounters:  01/17/21 153 lb (69.4 kg)  02/04/17 167 lb (75.8 kg)  02/05/16 150 lb (68 kg)     Physical Exam Vitals and nursing note reviewed.  Constitutional:      General: She is not in acute distress.    Appearance: Normal appearance. She is well-developed, well-groomed and overweight. She is not ill-appearing, toxic-appearing or diaphoretic.  HENT:     Head: Normocephalic and atraumatic.     Jaw: There is normal jaw occlusion.     Right Ear: Hearing normal. A middle ear effusion is present. Tympanic membrane is not perforated or erythematous.     Left Ear: Hearing normal. A middle ear effusion is present. Tympanic membrane is not perforated or erythematous.     Nose: Congestion and rhinorrhea present. Rhinorrhea is purulent.     Right Turbinates: Swollen.     Left Turbinates: Swollen.     Right Sinus: Maxillary sinus tenderness and frontal sinus tenderness present.     Left Sinus: Maxillary sinus tenderness and frontal sinus tenderness present.     Mouth/Throat:  Lips: Pink.     Mouth: Mucous membranes are moist.     Pharynx: Oropharynx is clear. Uvula midline. Posterior oropharyngeal erythema present. No pharyngeal swelling, oropharyngeal exudate or uvula swelling.     Tonsils: No tonsillar exudate or tonsillar abscesses. 2+ on the right. 2+ on the left.  Eyes:     General: Lids are normal.     Extraocular Movements: Extraocular movements intact.     Conjunctiva/sclera: Conjunctivae normal.     Pupils: Pupils are equal, round, and reactive to light.  Neck:     Thyroid: No thyroid mass, thyromegaly or thyroid tenderness.     Vascular: No carotid bruit or JVD.     Trachea: Trachea and phonation normal.  Cardiovascular:     Rate and Rhythm: Normal rate and regular rhythm.     Chest Wall: PMI is not displaced.     Pulses: Normal pulses.     Heart sounds: Normal heart sounds. No murmur heard.   No friction rub. No gallop.  Pulmonary:     Effort: Pulmonary effort is normal. No respiratory distress.     Breath sounds: Normal breath sounds. No wheezing.  Abdominal:      General: Bowel sounds are normal. There is no distension or abdominal bruit.     Palpations: Abdomen is soft. There is no hepatomegaly or splenomegaly.     Tenderness: There is no abdominal tenderness. There is no right CVA tenderness or left CVA tenderness.     Hernia: No hernia is present.  Musculoskeletal:        General: Normal range of motion.     Cervical back: Normal range of motion and neck supple.     Right lower leg: No edema.     Left lower leg: No edema.  Lymphadenopathy:     Cervical: No cervical adenopathy.  Skin:    General: Skin is warm and dry.     Capillary Refill: Capillary refill takes less than 2 seconds.     Coloration: Skin is not cyanotic, jaundiced or pale.     Findings: No rash.  Neurological:     General: No focal deficit present.     Mental Status: She is alert and oriented to person, place, and time.     Sensory: Sensation is intact.     Motor: Motor function is intact.     Coordination: Coordination is intact.     Gait: Gait is intact.     Deep Tendon Reflexes: Reflexes are normal and symmetric.  Psychiatric:        Attention and Perception: Attention and perception normal.        Mood and Affect: Mood and affect normal.        Speech: Speech normal.        Behavior: Behavior normal. Behavior is cooperative.        Thought Content: Thought content normal.        Cognition and Memory: Cognition and memory normal.        Judgment: Judgment normal.    Results for orders placed or performed during the hospital encounter of 02/05/16  Urinalysis, Routine w reflex microscopic (not at [facility])  Result Value Ref Range   Color, Urine YELLOW YELLOW   APPearance CLEAR CLEAR   Specific Gravity, Urine 1.025 1.005 - 1.030   pH 6.0 5.0 - 8.0   Glucose, UA NEGATIVE NEGATIVE mg/dL   Hgb urine dipstick SMALL (A) NEGATIVE   Bilirubin Urine NEGATIVE NEGATIVE   Ketones,  ur NEGATIVE NEGATIVE mg/dL   Protein, ur NEGATIVE NEGATIVE mg/dL   Nitrite NEGATIVE NEGATIVE    Leukocytes, UA NEGATIVE NEGATIVE  Pregnancy, urine  Result Value Ref Range   Preg Test, Ur NEGATIVE NEGATIVE  Urine microscopic-add on  Result Value Ref Range   Squamous Epithelial / LPF 0-5 (A) NONE SEEN   WBC, UA 0-5 0 - 5 WBC/hpf   RBC / HPF 0-5 0 - 5 RBC/hpf   Bacteria, UA FEW (A) NONE SEEN       Pertinent labs & imaging results that were available during my care of the patient were reviewed by me and considered in my medical decision making.  Assessment & Plan:  Sara Sweeney was seen today for new patient (initial visit).  Diagnoses and all orders for this visit:  Chronic cough Ongoing symptoms for 2 years.  Has failed PPI and antihistamine therapy.  Will check below labs.  Had Singulair nightly.  Will refer to GI for possible endoscopy. -     CMP14+EGFR -     CBC with Differential/Platelet -     Thyroid Panel With TSH -     montelukast (SINGULAIR) 10 MG tablet; Take 1 tablet (10 mg total) by mouth at bedtime.  Gastroesophageal reflux disease without esophagitis Ongoing symptoms for 2 years.  Has been on PPI for the last 2 months without resolution of symptoms.  Labs pending.  Will refer to GI for possible endoscopy. -     CMP14+EGFR -     CBC with Differential/Platelet -     esomeprazole (NEXIUM) 40 MG capsule; Take 1 capsule (40 mg total) by mouth daily at 12 noon.  Screening for colorectal cancer Referral to GI placed. -     Ambulatory referral to Gastroenterology  Vitamin D deficiency On repletion therapy.  We will check labs today and adjust if warranted. -     VITAMIN D 25 Hydroxy (Vit-D Deficiency, Fractures)  BMI 27.0-27.9,adult Labs pending.  Diet and exercise encouraged. -     CMP14+EGFR -     CBC with Differential/Platelet -     Lipid panel -     Thyroid Panel With TSH  Non-seasonal allergic rhinitis due to pollen Ongoing postnasal drip and chronic cough.  Will add Singulair to current regimen.  If endoscopy is normal, will refer to ENT. -     montelukast  (SINGULAIR) 10 MG tablet; Take 1 tablet (10 mg total) by mouth at bedtime. -     fexofenadine (ALLEGRA) 180 MG tablet; Take 1 tablet (180 mg total) by mouth daily.  Acute non-recurrent pansinusitis Ongoing sinusitis symptoms for over 2 weeks.  Will initiate Augmentin as prescribed patient aware to continue Flonase. -     amoxicillin-clavulanate (AUGMENTIN) 875-125 MG tablet; Take 1 tablet by mouth 2 (two) times daily for 10 days.  History of candidal vulvovaginitis -     fluconazole (DIFLUCAN) 150 MG tablet; Take 1 tablet (150 mg total) by mouth once for 1 dose.     Continue all other maintenance medications.  Follow up plan: Return in about 6 months (around 07/17/2021), or if symptoms worsen or fail to improve.   Continue healthy lifestyle choices, including diet (rich in fruits, vegetables, and lean proteins, and low in salt and simple carbohydrates) and exercise (at least 30 minutes of moderate physical activity daily).  Educational handout given for sinusitis   The above assessment and management plan was discussed with the patient. The patient verbalized understanding of and has agreed to  the management plan. Patient is aware to call the clinic if they develop any new symptoms or if symptoms persist or worsen. Patient is aware when to return to the clinic for a follow-up visit. Patient educated on when it is appropriate to go to the emergency department.   Michelle Marlaysia Lenig, FNP-C [facility] [phone]   

## 2021-01-18 LAB — LIPID PANEL
Chol/HDL Ratio: 3.9 ratio (ref 0.0–4.4)
Cholesterol, Total: 225 mg/dL — ABNORMAL HIGH (ref 100–199)
HDL: 57 mg/dL (ref >39)
LDL Chol Calc (NIH): 140 mg/dL — ABNORMAL HIGH (ref 0–99)
Triglycerides: 155 mg/dL — ABNORMAL HIGH (ref 0–149)
VLDL Cholesterol Cal: 28 mg/dL (ref 5–40)

## 2021-01-18 LAB — CBC WITH DIFFERENTIAL/PLATELET
Basophils Absolute: 0 10*3/uL (ref 0.0–0.2)
Basos: 0 %
EOS (ABSOLUTE): 0.1 10*3/uL (ref 0.0–0.4)
Eos: 1 %
Hematocrit: 42.6 % (ref 34.0–46.6)
Hemoglobin: 14.1 g/dL (ref 11.1–15.9)
Immature Grans (Abs): 0 10*3/uL (ref 0.0–0.1)
Immature Granulocytes: 0 %
Lymphocytes Absolute: 2.2 10*3/uL (ref 0.7–3.1)
Lymphs: 29 %
MCH: 32.2 pg (ref 26.6–33.0)
MCHC: 33.1 g/dL (ref 31.5–35.7)
MCV: 97 fL (ref 79–97)
Monocytes Absolute: 0.6 10*3/uL (ref 0.1–0.9)
Monocytes: 7 %
Neutrophils Absolute: 4.7 10*3/uL (ref 1.4–7.0)
Neutrophils: 63 %
Platelets: 335 10*3/uL (ref 150–450)
RBC: 4.38 x10E6/uL (ref 3.77–5.28)
RDW: 13 % (ref 11.7–15.4)
WBC: 7.5 10*3/uL (ref 3.4–10.8)

## 2021-01-18 LAB — CMP14+EGFR
ALT: 27 IU/L (ref 0–32)
AST: 19 IU/L (ref 0–40)
Albumin/Globulin Ratio: 1.9 (ref 1.2–2.2)
Albumin: 4.6 g/dL (ref 3.8–4.8)
Alkaline Phosphatase: 78 IU/L (ref 44–121)
BUN/Creatinine Ratio: 15 (ref 9–23)
BUN: 14 mg/dL (ref 6–24)
Bilirubin Total: 0.2 mg/dL (ref 0.0–1.2)
CO2: 24 mmol/L (ref 20–29)
Calcium: 9.5 mg/dL (ref 8.7–10.2)
Chloride: 104 mmol/L (ref 96–106)
Creatinine, Ser: 0.94 mg/dL (ref 0.57–1.00)
Globulin, Total: 2.4 g/dL (ref 1.5–4.5)
Glucose: 79 mg/dL (ref 70–99)
Potassium: 3.8 mmol/L (ref 3.5–5.2)
Sodium: 141 mmol/L (ref 134–144)
Total Protein: 7 g/dL (ref 6.0–8.5)
eGFR: 75 mL/min/{1.73_m2} (ref >59)

## 2021-01-18 LAB — THYROID PANEL WITH TSH
Free Thyroxine Index: 2.3 (ref 1.2–4.9)
T3 Uptake Ratio: 25 % (ref 24–39)
T4, Total: 9 ug/dL (ref 4.5–12.0)
TSH: 0.94 u[IU]/mL (ref 0.450–4.500)

## 2021-01-18 LAB — VITAMIN D 25 HYDROXY (VIT D DEFICIENCY, FRACTURES): Vit D, 25-Hydroxy: 39.9 ng/mL (ref 30.0–100.0)

## 2021-01-21 ENCOUNTER — Encounter: Payer: Self-pay | Admitting: Internal Medicine

## 2021-02-04 ENCOUNTER — Ambulatory Visit (INDEPENDENT_AMBULATORY_CARE_PROVIDER_SITE_OTHER): Payer: BC Managed Care – PPO | Admitting: Family Medicine

## 2021-02-04 ENCOUNTER — Encounter: Payer: Self-pay | Admitting: Family Medicine

## 2021-02-04 DIAGNOSIS — N898 Other specified noninflammatory disorders of vagina: Secondary | ICD-10-CM

## 2021-02-04 DIAGNOSIS — Z8619 Personal history of other infectious and parasitic diseases: Secondary | ICD-10-CM

## 2021-02-04 DIAGNOSIS — R399 Unspecified symptoms and signs involving the genitourinary system: Secondary | ICD-10-CM

## 2021-02-04 MED ORDER — NITROFURANTOIN MONOHYD MACRO 100 MG PO CAPS: 100.0000 mg | ORAL_CAPSULE | Freq: Two times a day (BID) | ORAL | 0 refills | Status: DC

## 2021-02-04 MED ORDER — FLUCONAZOLE 150 MG PO TABS: ORAL_TABLET | ORAL | 0 refills | Status: DC

## 2021-02-04 MED ORDER — NITROFURANTOIN MONOHYD MACRO 100 MG PO CAPS: 100.0000 mg | ORAL_CAPSULE | Freq: Two times a day (BID) | ORAL | 0 refills | Status: AC

## 2021-02-04 NOTE — Addendum Note (Signed)
Addended by: Sonny Masters on: 02/04/2021 01:59 PM   Modules accepted: Orders

## 2021-02-04 NOTE — Progress Notes (Signed)
Virtual Visit via telephone Note Due to COVID-19 pandemic this visit was conducted virtually. This visit type was conducted due to national recommendations for restrictions regarding the COVID-19 Pandemic (e.g. social distancing, sheltering in place) in an effort to limit this patient's exposure and mitigate transmission in our community. All issues noted in this document were discussed and addressed.  A physical exam was not performed with this format.   I connected with Sara Sweeney on 02/04/2021 at [place] by telephone and verified that I am speaking with the correct person using two identifiers. Sara Sweeney is currently located at home and patient is currently with them during visit. The provider, Monia Pouch, FNP is located in their office at time of visit.  I discussed the limitations, risks, security and privacy concerns of performing an evaluation and management service by telephone and the availability of in person appointments. I also discussed with the patient that there may be a patient responsible charge related to this service. The patient expressed understanding and agreed to proceed.  Subjective:  Patient ID: Sara Sweeney, female    DOB: 04/20/1975, 45 y.o.   MRN: [id number]  Chief Complaint:  Dysuria   HPI: Sara Sweeney is a 45 y.o. female presenting on 02/04/2021 for Dysuria   Pt reports dysuria, frequency, and urgency for the last 2 days. She also reports continue vaginal itching and white discharge post antibiotic therapy for sinusitis.   Dysuria  This is a new problem. The current episode started yesterday. The problem has been waxing and waning. The quality of the pain is described as aching and burning. The pain is at a severity of 3/10. The pain is mild. There has been no fever. She is Not sexually active. There is No history of pyelonephritis. Associated symptoms include a discharge, frequency and urgency. Pertinent negatives include no chills, flank  pain, hematuria, hesitancy, nausea, possible pregnancy, sweats or vomiting. She has tried increased fluids for the symptoms. The treatment provided no relief.    Relevant past medical, surgical, family, and social history reviewed and updated as indicated.  Allergies and medications reviewed and updated.   Past Medical History:  Diagnosis Date   Allergy    Anxiety    Migraine     Past Surgical History:  Procedure Laterality Date   ABLATION     FOOT SURGERY     FRACTURE SURGERY     HERNIA REPAIR     JOINT REPLACEMENT     TUBAL LIGATION      Social History   Socioeconomic History   Marital status: Not on file    Spouse name: Not on file   Number of children: Not on file   Years of education: Not on file   Highest education level: Not on file  Occupational History   Not on file  Tobacco Use   Smoking status: Former   Smokeless tobacco: Never  Substance and Sexual Activity   Alcohol use: No   Drug use: No   Sexual activity: Yes    Birth control/protection: Surgical  Other Topics Concern   Not on file  Social History Narrative   Not on file   Social Determinants of Health   Financial Resource Strain: Not on file  Food Insecurity: Not on file  Transportation Needs: Not on file  Physical Activity: Not on file  Stress: Not on file  Social Connections: Not on file  Intimate Partner Violence: Not on file    Outpatient Encounter  Medications as of 02/04/2021  Medication Sig   fluconazole (DIFLUCAN) 150 MG tablet 1 po q week x 4 weeks   nitrofurantoin, macrocrystal-monohydrate, (MACROBID) 100 MG capsule Take 1 capsule (100 mg total) by mouth 2 (two) times daily for 5 days. 1 po BId   esomeprazole (NEXIUM) 40 MG capsule Take 1 capsule (40 mg total) by mouth daily at 12 noon.   fexofenadine (ALLEGRA) 180 MG tablet Take 1 tablet (180 mg total) by mouth daily.   ibuprofen (ADVIL,MOTRIN) 600 MG tablet Take 1 tablet (600 mg total) by mouth 4 (four) times daily.    montelukast (SINGULAIR) 10 MG tablet Take 1 tablet (10 mg total) by mouth at bedtime.   [DISCONTINUED] esomeprazole (NEXIUM) 40 MG capsule Take 40 mg by mouth daily at 12 noon.   [DISCONTINUED] fexofenadine (ALLEGRA) 180 MG tablet Take 180 mg by mouth daily.   No facility-administered encounter medications on file as of 02/04/2021.    No Known Allergies  Review of Systems  Constitutional:  Negative for activity change, appetite change, chills, diaphoresis, fatigue, fever and unexpected weight change.  Gastrointestinal:  Negative for abdominal pain, nausea and vomiting.  Genitourinary:  Positive for dysuria, frequency, urgency and vaginal discharge. Negative for decreased urine volume, difficulty urinating, dyspareunia, enuresis, flank pain, genital sores, hematuria, hesitancy, menstrual problem, pelvic pain, vaginal bleeding and vaginal pain.  Musculoskeletal:  Negative for back pain.  Neurological:  Negative for weakness.  Psychiatric/Behavioral:  Negative for confusion.   All other systems reviewed and are negative.       Observations/Objective: No vital signs or physical exam, this was a telephone or virtual health encounter.  Pt alert and oriented, answers all questions appropriately, and able to speak in full sentences.    Assessment and Plan: Sara Sweeney was seen today for dysuria.  Diagnoses and all orders for this visit:  UTI symptoms Reported symptoms consistent with UTI. Will treat with macrobid. If symptoms persist, pt aware to follow up in office. Symptomatic care discussed in detail.  -     nitrofurantoin, macrocrystal-monohydrate, (MACROBID) 100 MG capsule; Take 1 capsule (100 mg total) by mouth 2 (two) times daily for 5 days. 1 po BId  Vaginal itching History of candidal vulvovaginitis Ongoing symptoms. Will treat with once weekly diflucan for 4 weeks. Report any new, worsening, or persistent symptoms.  -     fluconazole (DIFLUCAN) 150 MG tablet; 1 po q week x 4  weeks    Follow Up Instructions: Return if symptoms worsen or fail to improve.    I discussed the assessment and treatment plan with the patient. The patient was provided an opportunity to ask questions and all were answered. The patient agreed with the plan and demonstrated an understanding of the instructions.   The patient was advised to call back or seek an in-person evaluation if the symptoms worsen or if the condition fails to improve as anticipated.  The above assessment and management plan was discussed with the patient. The patient verbalized understanding of and has agreed to the management plan. Patient is aware to call the clinic if they develop any new symptoms or if symptoms persist or worsen. Patient is aware when to return to the clinic for a follow-up visit. Patient educated on when it is appropriate to go to the emergency department.    I provided 11 minutes of non-face-to-face time during this encounter. The call started at [place]. The call ended at 0800. The other time was used for coordination of care.  Kari Baars, FNP-C [facility] [address] [place], [place] [address] [phone] 02/04/2021

## 2021-02-04 NOTE — Addendum Note (Signed)
Addended by: Sonny Masters on: 02/04/2021 04:41 PM   Modules accepted: Orders

## 2021-03-10 DIAGNOSIS — M25522 Pain in left elbow: Secondary | ICD-10-CM | POA: Diagnosis not present

## 2021-03-10 DIAGNOSIS — M79642 Pain in left hand: Secondary | ICD-10-CM | POA: Diagnosis not present

## 2021-03-10 DIAGNOSIS — S60222A Contusion of left hand, initial encounter: Secondary | ICD-10-CM | POA: Diagnosis not present

## 2021-03-10 DIAGNOSIS — S53402A Unspecified sprain of left elbow, initial encounter: Secondary | ICD-10-CM | POA: Diagnosis not present

## 2021-03-13 ENCOUNTER — Encounter: Payer: Self-pay | Admitting: *Deleted

## 2021-03-13 ENCOUNTER — Ambulatory Visit (INDEPENDENT_AMBULATORY_CARE_PROVIDER_SITE_OTHER): Payer: Self-pay | Admitting: *Deleted

## 2021-03-13 ENCOUNTER — Other Ambulatory Visit: Payer: Self-pay

## 2021-03-13 ENCOUNTER — Other Ambulatory Visit: Payer: Self-pay | Admitting: Family Medicine

## 2021-03-13 VITALS — Ht 63.0 in | Wt 157.4 lb

## 2021-03-13 DIAGNOSIS — K449 Diaphragmatic hernia without obstruction or gangrene: Secondary | ICD-10-CM

## 2021-03-13 DIAGNOSIS — K219 Gastro-esophageal reflux disease without esophagitis: Secondary | ICD-10-CM

## 2021-03-13 DIAGNOSIS — Z1211 Encounter for screening for malignant neoplasm of colon: Secondary | ICD-10-CM

## 2021-03-13 DIAGNOSIS — R053 Chronic cough: Secondary | ICD-10-CM

## 2021-03-13 MED ORDER — NA SULFATE-K SULFATE-MG SULF 17.5-3.13-1.6 GM/177ML PO SOLN: 1.0000 | Freq: Once | ORAL | 0 refills | Status: AC

## 2021-03-13 NOTE — Progress Notes (Signed)
Pt made aware to have pregnancy test on 04/02/2021 at [facility].

## 2021-03-13 NOTE — Progress Notes (Signed)
Gastroenterology Pre-Procedure Review  Request Date: 03/13/2021 Requesting Physician: Darla Lesches, [place] [facility] [place], no previous TCS  PATIENT REVIEW QUESTIONS: The patient responded to the following health history questions as indicated:    1. Diabetes Melitis: no 2. Joint replacements in the past 12 months: no 3. Major health problems in the past 3 months: no 4. Has an artificial valve or MVP: no 5. Has a defibrillator: no 6. Has been advised in past to take antibiotics in advance of a procedure like teeth cleaning: no 7. Family history of colon cancer: no 8. Alcohol Use: yes, 2 beers 2 to 3 times a week 9. Illicit drug Use: no 10. History of sleep apnea: no 11. History of coronary artery or other vascular stents placed within the last 12 months: no 12. History of any prior anesthesia complications: no 13. Body mass index is 27.88 kg/m.    MEDICATIONS & ALLERGIES:    Patient reports the following regarding taking any blood thinners:   Plavix? no Aspirin? no Coumadin? no Brilinta? no Xarelto? no Eliquis? no Pradaxa? no Savaysa? no Effient? no  Patient confirms/reports the following medications:  Current Outpatient Medications  Medication Sig Dispense Refill   Cholecalciferol (VITAMIN D-3) 125 MCG (5000 UT) TABS Take by mouth daily at 6 (six) AM.     esomeprazole (NEXIUM) 40 MG capsule Take 1 capsule (40 mg total) by mouth daily at 12 noon. 90 capsule 2   fexofenadine (ALLEGRA) 180 MG tablet Take 1 tablet (180 mg total) by mouth daily. 90 tablet 2   ibuprofen (ADVIL) 200 MG tablet Take 200 mg by mouth every 6 (six) hours as needed. Takes 3 to 4 tablets as needed.     montelukast (SINGULAIR) 10 MG tablet Take 1 tablet (10 mg total) by mouth at bedtime. 30 tablet 5   Red Yeast Rice Extract (RED YEAST RICE PO) Take by mouth daily at 6 (six) AM. Takes 2 tablets daily.     No current facility-administered medications for this visit.    Patient  confirms/reports the following allergies:  No Known Allergies  No orders of the defined types were placed in this encounter.   AUTHORIZATION INFORMATION Primary Insurance: [employer],  [place] #: [id number],  Group #: [id number] Pre-Cert / Auth required: No, not required  SCHEDULE INFORMATION: Procedure has been scheduled as follows:  Date: 04/04/2021, Time: 8:30  Location: [facility] with Dr. Abbey Chatters  This Gastroenterology Pre-Precedure Review Form is being routed to the following provider(s): Neil Crouch, PA-C

## 2021-03-19 ENCOUNTER — Other Ambulatory Visit: Payer: Self-pay | Admitting: *Deleted

## 2021-03-19 DIAGNOSIS — Z1211 Encounter for screening for malignant neoplasm of colon: Secondary | ICD-10-CM

## 2021-03-19 NOTE — Progress Notes (Signed)
Ok to schedule. ASA I. 

## 2021-03-20 NOTE — Progress Notes (Signed)
Colonoscopies are covered starting at age 48 per Jettie Booze at [facility].  REF#: [id number]

## 2021-03-26 ENCOUNTER — Other Ambulatory Visit: Payer: Self-pay | Admitting: Family Medicine

## 2021-03-26 ENCOUNTER — Telehealth: Payer: Self-pay | Admitting: *Deleted

## 2021-03-26 DIAGNOSIS — F411 Generalized anxiety disorder: Secondary | ICD-10-CM

## 2021-03-26 DIAGNOSIS — F320 Major depressive disorder, single episode, mild: Secondary | ICD-10-CM

## 2021-03-26 MED ORDER — ESCITALOPRAM OXALATE 10 MG PO TABS: 10.0000 mg | ORAL_TABLET | Freq: Every day | ORAL | 1 refills | Status: DC

## 2021-03-26 NOTE — Telephone Encounter (Signed)
Spoke to pt.  She agreed to a virtual video visit tomorrow at 1:00 with Tana Coast, PA-C.

## 2021-03-26 NOTE — Telephone Encounter (Signed)
Pt has colonoscopy scheduled for 04/04/2021.  Pt called in today and informed me that she would like to have an EGD performed on same day.  She said that she is having choking sensations.  Says that this has been going on for 2 years.   Neil Crouch, PA-C:  Do I need to cancel her procedure and bring her in for consult for EGD since she is having those issues or just add it on to her procedure?  There is some flexibility on that procedure date.  I would just have to move her time.

## 2021-03-26 NOTE — Telephone Encounter (Signed)
PATIENT CALLED ASKING IF THE REFERRAL HAD BEEN SENT SO SHE CAN HAVE AN ENDO AS WELL AS HER TCS

## 2021-03-26 NOTE — Telephone Encounter (Signed)
We haven't seen the patient since 2008. She is requesting an EGD for problems therefore she will need an office visit.   I am willing to do a virtual visit with her tomorrow at one of the following times and then if her symptoms warrant an EGD then we can add one.   See if she can do a visit at 8:00, 10:00, or 1:00. If those times don't work for her see what time will and we will try to accommodate.   SINCE SHE IS A NEW PATIENT SHE WILL NEED TO BE ABLE TO USE VIDEO.

## 2021-03-27 ENCOUNTER — Encounter: Payer: Self-pay | Admitting: Gastroenterology

## 2021-03-27 ENCOUNTER — Telehealth (INDEPENDENT_AMBULATORY_CARE_PROVIDER_SITE_OTHER): Payer: BC Managed Care – PPO | Admitting: Gastroenterology

## 2021-03-27 DIAGNOSIS — Z1211 Encounter for screening for malignant neoplasm of colon: Secondary | ICD-10-CM

## 2021-03-27 DIAGNOSIS — R1310 Dysphagia, unspecified: Secondary | ICD-10-CM

## 2021-03-27 DIAGNOSIS — K219 Gastro-esophageal reflux disease without esophagitis: Secondary | ICD-10-CM

## 2021-03-27 DIAGNOSIS — R0989 Other specified symptoms and signs involving the circulatory and respiratory systems: Secondary | ICD-10-CM | POA: Diagnosis not present

## 2021-03-27 NOTE — Progress Notes (Signed)
Primary Care Physician:  Baruch Gouty, FNP Primary GI:  Elon Alas. Abbey Chatters, DO   Patient Location: Home  Provider Location: [facility]   Reason for Visit:  Chief Complaint  Patient presents with   consult EGD    Reports has choking sensations.    consult TCS    Constipation at times     Persons present on the virtual encounter, with roles: Patient, myself (provider),Mindy Estudillo, CMA(updated meds and allergies)  Total time (minutes) spent on medical discussion: 15 minutes  Due to COVID-19, visit was conducted using Mychart video method.  Visit was requested by patient.  Virtual Visit via Mychart video  I connected with Sara Sweeney on 03/27/21 at  1:00 PM EST by Mychart video and verified that I am speaking with the correct person using two identifiers.   I discussed the limitations, risks, security and privacy concerns of performing an evaluation and management service by telephone/video and the availability of in person appointments. I also discussed with the patient that there may be a patient responsible charge related to this service. The patient expressed understanding and agreed to proceed.   HPI:   Sara Sweeney is a 46 y.o. female who presents for virtual visit regarding dysphagia, colonoscopy.   Patient was recently triaged for screening colonoscopy, currently scheduled for January 27.  No prior colonoscopy.  No family history of colon cancer.  Has occasional constipation.  Patient called in recently requesting upper endoscopy to be done at time of her colonoscopy for history of choking, swallowing issues.    Patient had a EGD in December 2008 with Dr. Oneida Alar for epigastric pain.  She had a small hiatal hernia but otherwise unremarkable study.  She complains of 2-year history of intermittent symptoms which include early morning coughing, throat clearing.  Feels like something is stuck in her throat that she has to get up.  Happens with and without meals.   She had been on over-the-counter PPI for 6 months with no notable improvement.  Few months ago she started esomeprazole 40 mg daily.  Continues to have symptoms.  Not really having a lot of heartburn at this point.  No nausea or vomiting.  Denies abdominal pain.  Vague sensation of food going down slowly.  Occasional Advil use.        Current Outpatient Medications  Medication Sig Dispense Refill   Cholecalciferol (VITAMIN D-3) 125 MCG (5000 UT) TABS Take 5,000 Units by mouth daily at 6 (six) AM.     escitalopram (LEXAPRO) 10 MG tablet Take 1 tablet (10 mg total) by mouth daily. 30 tablet 1   esomeprazole (NEXIUM) 40 MG capsule Take 1 capsule (40 mg total) by mouth daily at 12 noon. (Patient taking differently: Take 40 mg by mouth daily.) 90 capsule 2   fexofenadine (ALLEGRA) 180 MG tablet Take 1 tablet (180 mg total) by mouth daily. 90 tablet 2   ibuprofen (ADVIL) 200 MG tablet Take 800 mg by mouth every 8 (eight) hours as needed for moderate pain.     montelukast (SINGULAIR) 10 MG tablet Take 1 tablet (10 mg total) by mouth at bedtime. 30 tablet 5   Red Yeast Rice Extract (RED YEAST RICE PO) Take 2 capsules by mouth daily at 6 (six) AM.     vitamin C (ASCORBIC ACID) 500 MG tablet Take 500 mg by mouth daily.     No current facility-administered medications for this visit.    Past Medical History:  Diagnosis Date   Allergy    Anxiety    Migraine     Past Surgical History:  Procedure Laterality Date   ABLATION     ESOPHAGOGASTRODUODENOSCOPY  2008   Dr. Oneida Alar: small hiatal hernia   FOOT SURGERY     FRACTURE SURGERY     HERNIA REPAIR     JOINT REPLACEMENT     TUBAL LIGATION      Family History  Problem Relation Age of Onset   COPD Mother    Cancer Mother    Alcohol abuse Father    Hyperlipidemia Sister    Hypertension Sister    Heart disease Sister     Social History   Socioeconomic History   Marital status: Single    Spouse name: Not on file   Number of children:  Not on file   Years of education: Not on file   Highest education level: Not on file  Occupational History   Not on file  Tobacco Use   Smoking status: Former   Smokeless tobacco: Never  Substance and Sexual Activity   Alcohol use: No   Drug use: No   Sexual activity: Yes    Birth control/protection: Surgical  Other Topics Concern   Not on file  Social History Narrative   Not on file   Social Determinants of Health   Financial Resource Strain: Not on file  Food Insecurity: Not on file  Transportation Needs: Not on file  Physical Activity: Not on file  Stress: Not on file  Social Connections: Not on file  Intimate Partner Violence: Not on file      ROS:  General: Negative for anorexia, weight loss, fever, chills, fatigue, weakness. Eyes: Negative for vision changes.  ENT: Negative for hoarseness,  nasal congestion.  See HPI CV: Negative for chest pain, angina, palpitations, dyspnea on exertion, peripheral edema.  Respiratory: Negative for dyspnea at rest, dyspnea on exertion, cough, sputum, wheezing.  GI: See history of present illness. GU:  Negative for dysuria, hematuria, urinary incontinence, urinary frequency, nocturnal urination.  MS: Negative for joint pain, low back pain.  Derm: Negative for rash or itching.  Neuro: Negative for weakness, abnormal sensation, seizure, frequent headaches, memory loss, confusion.  Psych: Negative for anxiety, depression, suicidal ideation, hallucinations.  Endo: Negative for unusual weight change.  Heme: Negative for bruising or bleeding. Allergy: Negative for rash or hives.   Observations/Objective: Alert, well nourished well developed female in NAD.   Lab Results  Component Value Date   WBC 7.5 01/17/2021   HGB 14.1 01/17/2021   HCT 42.6 01/17/2021   MCV 97 01/17/2021   PLT 335 01/17/2021   Lab Results  Component Value Date   CREATININE 0.94 01/17/2021   BUN 14 01/17/2021   NA 141 01/17/2021   K 3.8 01/17/2021   CL  104 01/17/2021   CO2 24 01/17/2021   Lab Results  Component Value Date   ALT 27 01/17/2021   AST 19 01/17/2021   ALKPHOS 78 01/17/2021   BILITOT 0.2 01/17/2021   Lab Results  Component Value Date   TSH 0.940 01/17/2021    Assessment and Plan:  Screening colonoscopy: no GI symptoms. First ever colonoscopy. No FH of CRC.   GERD/globus/?dysphagia: no response to PPI for greater than six months. No typical heartburn but throat clearing, globus, vague dysphagia. Symptoms may be secondary to GERD, stricture, less likely eosinophilic esophagitis.  Plan for colonoscopy/EGD+/- esophageal dilation with Dr. Abbey Chatters.  ASA 1.  I  have discussed the risks, alternatives, benefits with regards to but not limited to the risk of reaction to medication, bleeding, infection, perforation and the patient is agreeable to proceed. Written consent to be obtained.  Continue esomeprazole 40 mg daily.  Follow Up Instructions:    I discussed the assessment and treatment plan with the patient. The patient was provided an opportunity to ask questions and all were answered. The patient agreed with the plan and demonstrated an understanding of the instructions. AVS mailed to patient's home address.   The patient was advised to call back or seek an in-person evaluation if the symptoms worsen or if the condition fails to improve as anticipated.  I provided 15 minutes of virtual face-to-face time during this encounter.   Neil Crouch, PA-C

## 2021-03-27 NOTE — Telephone Encounter (Signed)
Visit completed. Ok to add on EGD+/-ED to colonoscopy. Dx: chronic gerd, globus/dysphagia. Note to follow.

## 2021-03-27 NOTE — H&P (View-Only) (Signed)
Primary Care Physician:  Baruch Gouty, FNP Primary GI:  Elon Alas. Abbey Chatters, DO   Patient Location: Home  Provider Location: [facility]   Reason for Visit:  Chief Complaint  Patient presents with   consult EGD    Reports has choking sensations.    consult TCS    Constipation at times     Persons present on the virtual encounter, with roles: Patient, myself (provider),Mindy Estudillo, CMA(updated meds and allergies)  Total time (minutes) spent on medical discussion: 15 minutes  Due to COVID-19, visit was conducted using Mychart video method.  Visit was requested by patient.  Virtual Visit via Mychart video  I connected with Sara Sweeney on 03/27/21 at  1:00 PM EST by Mychart video and verified that I am speaking with the correct person using two identifiers.   I discussed the limitations, risks, security and privacy concerns of performing an evaluation and management service by telephone/video and the availability of in person appointments. I also discussed with the patient that there may be a patient responsible charge related to this service. The patient expressed understanding and agreed to proceed.   HPI:   Sara Sweeney is a 46 y.o. female who presents for virtual visit regarding dysphagia, colonoscopy.   Patient was recently triaged for screening colonoscopy, currently scheduled for January 27.  No prior colonoscopy.  No family history of colon cancer.  Has occasional constipation.  Patient called in recently requesting upper endoscopy to be done at time of her colonoscopy for history of choking, swallowing issues.    Patient had a EGD in December 2008 with Dr. Oneida Alar for epigastric pain.  She had a small hiatal hernia but otherwise unremarkable study.  She complains of 2-year history of intermittent symptoms which include early morning coughing, throat clearing.  Feels like something is stuck in her throat that she has to get up.  Happens with and without meals.   She had been on over-the-counter PPI for 6 months with no notable improvement.  Few months ago she started esomeprazole 40 mg daily.  Continues to have symptoms.  Not really having a lot of heartburn at this point.  No nausea or vomiting.  Denies abdominal pain.  Vague sensation of food going down slowly.  Occasional Advil use.        Current Outpatient Medications  Medication Sig Dispense Refill   Cholecalciferol (VITAMIN D-3) 125 MCG (5000 UT) TABS Take 5,000 Units by mouth daily at 6 (six) AM.     escitalopram (LEXAPRO) 10 MG tablet Take 1 tablet (10 mg total) by mouth daily. 30 tablet 1   esomeprazole (NEXIUM) 40 MG capsule Take 1 capsule (40 mg total) by mouth daily at 12 noon. (Patient taking differently: Take 40 mg by mouth daily.) 90 capsule 2   fexofenadine (ALLEGRA) 180 MG tablet Take 1 tablet (180 mg total) by mouth daily. 90 tablet 2   ibuprofen (ADVIL) 200 MG tablet Take 800 mg by mouth every 8 (eight) hours as needed for moderate pain.     montelukast (SINGULAIR) 10 MG tablet Take 1 tablet (10 mg total) by mouth at bedtime. 30 tablet 5   Red Yeast Rice Extract (RED YEAST RICE PO) Take 2 capsules by mouth daily at 6 (six) AM.     vitamin C (ASCORBIC ACID) 500 MG tablet Take 500 mg by mouth daily.     No current facility-administered medications for this visit.    Past Medical History:  Diagnosis Date  ° Allergy   ° Anxiety   ° Migraine   ° ° °Past Surgical History:  °Procedure Laterality Date  ° ABLATION    ° ESOPHAGOGASTRODUODENOSCOPY  2008  ° Dr. Fields: small hiatal hernia  ° FOOT SURGERY    ° FRACTURE SURGERY    ° HERNIA REPAIR    ° JOINT REPLACEMENT    ° TUBAL LIGATION    ° ° °Family History  °Problem Relation Age of Onset  ° COPD Mother   ° Cancer Mother   ° Alcohol abuse Father   ° Hyperlipidemia Sister   ° Hypertension Sister   ° Heart disease Sister   ° ° °Social History  ° °Socioeconomic History  ° Marital status: Single  °  Spouse name: Not on file  ° Number of children:  Not on file  ° Years of education: Not on file  ° Highest education level: Not on file  °Occupational History  ° Not on file  °Tobacco Use  ° Smoking status: Former  ° Smokeless tobacco: Never  °Substance and Sexual Activity  ° Alcohol use: No  ° Drug use: No  ° Sexual activity: Yes  °  Birth control/protection: Surgical  °Other Topics Concern  ° Not on file  °Social History Narrative  ° Not on file  ° °Social Determinants of Health  ° °Financial Resource Strain: Not on file  °Food Insecurity: Not on file  °Transportation Needs: Not on file  °Physical Activity: Not on file  °Stress: Not on file  °Social Connections: Not on file  °Intimate Partner Violence: Not on file  ° ° °  °ROS: ° °General: Negative for anorexia, weight loss, fever, chills, fatigue, weakness. °Eyes: Negative for vision changes.  °ENT: Negative for hoarseness,  nasal congestion.  See HPI °CV: Negative for chest pain, angina, palpitations, dyspnea on exertion, peripheral edema.  °Respiratory: Negative for dyspnea at rest, dyspnea on exertion, cough, sputum, wheezing.  °GI: See history of present illness. °GU:  Negative for dysuria, hematuria, urinary incontinence, urinary frequency, nocturnal urination.  °MS: Negative for joint pain, low back pain.  °Derm: Negative for rash or itching.  °Neuro: Negative for weakness, abnormal sensation, seizure, frequent headaches, memory loss, confusion.  °Psych: Negative for anxiety, depression, suicidal ideation, hallucinations.  °Endo: Negative for unusual weight change.  °Heme: Negative for bruising or bleeding. °Allergy: Negative for rash or hives. °  °Observations/Objective: °Alert, well nourished well developed female in NAD.  ° °Lab Results  °Component Value Date  ° WBC 7.5 01/17/2021  ° HGB 14.1 01/17/2021  ° HCT 42.6 01/17/2021  ° MCV 97 01/17/2021  ° PLT 335 01/17/2021  ° °Lab Results  °Component Value Date  ° CREATININE 0.94 01/17/2021  ° BUN 14 01/17/2021  ° NA 141 01/17/2021  ° K 3.8 01/17/2021  ° CL  104 01/17/2021  ° CO2 24 01/17/2021  ° °Lab Results  °Component Value Date  ° ALT 27 01/17/2021  ° AST 19 01/17/2021  ° ALKPHOS 78 01/17/2021  ° BILITOT 0.2 01/17/2021  ° °Lab Results  °Component Value Date  ° TSH 0.940 01/17/2021  ° ° °Assessment and Plan: ° °Screening colonoscopy: no GI symptoms. First ever colonoscopy. No FH of CRC.  ° °GERD/globus/?dysphagia: no response to PPI for greater than six months. No typical heartburn but throat clearing, globus, vague dysphagia. Symptoms may be secondary to GERD, stricture, less likely eosinophilic esophagitis. ° °Plan for colonoscopy/EGD+/- esophageal dilation with Dr. Carver.  ASA 1.  I   have discussed the risks, alternatives, benefits with regards to but not limited to the risk of reaction to medication, bleeding, infection, perforation and the patient is agreeable to proceed. Written consent to be obtained.  Continue esomeprazole 40 mg daily.  Follow Up Instructions:    I discussed the assessment and treatment plan with the patient. The patient was provided an opportunity to ask questions and all were answered. The patient agreed with the plan and demonstrated an understanding of the instructions. AVS mailed to patient's home address.   The patient was advised to call back or seek an in-person evaluation if the symptoms worsen or if the condition fails to improve as anticipated.  I provided 15 minutes of virtual face-to-face time during this encounter.   Neil Crouch, PA-C

## 2021-03-27 NOTE — Telephone Encounter (Signed)
Spoke to pt.  She is aware that we did not need to move her procedure time and to follow current instructions.  Reminded pt to go for pregnancy test on 04/02/2021 at [facility].

## 2021-03-27 NOTE — Telephone Encounter (Signed)
Noted.  Spoke to [place].  Orders cancelled.  Placed new orders for TCS/EGD/+/- ED.

## 2021-03-28 ENCOUNTER — Encounter: Payer: Self-pay | Admitting: Gastroenterology

## 2021-03-28 ENCOUNTER — Telehealth: Payer: Self-pay | Admitting: *Deleted

## 2021-03-28 DIAGNOSIS — K219 Gastro-esophageal reflux disease without esophagitis: Secondary | ICD-10-CM | POA: Insufficient documentation

## 2021-03-28 DIAGNOSIS — R1310 Dysphagia, unspecified: Secondary | ICD-10-CM | POA: Insufficient documentation

## 2021-03-28 DIAGNOSIS — R0989 Other specified symptoms and signs involving the circulatory and respiratory systems: Secondary | ICD-10-CM | POA: Insufficient documentation

## 2021-03-28 DIAGNOSIS — Z1211 Encounter for screening for malignant neoplasm of colon: Secondary | ICD-10-CM | POA: Insufficient documentation

## 2021-03-28 NOTE — Patient Instructions (Signed)
Colonoscopy and upper endoscopy to be scheduled. See separate instructions.  Continue esomeprazole 40 mg daily.

## 2021-03-28 NOTE — Telephone Encounter (Signed)
Pt consented to a virtual visit on 03/27/2021.

## 2021-03-28 NOTE — Telephone Encounter (Signed)
Enrica M Albertsen, you are scheduled for a virtual visit with your provider today.  Just as we do with appointments in the office, we must obtain your consent to participate.  Your consent will be active for this visit and any virtual visit you may have with one of our providers in the next 365 days.  If you have a MyChart account, I can also send a copy of this consent to you electronically.  All virtual visits are billed to your insurance company just like a traditional visit in the office.  As this is a virtual visit, video technology does not allow for your provider to perform a traditional examination.  This may limit your provider's ability to fully assess your condition.  If your provider identifies any concerns that need to be evaluated in person or the need to arrange testing such as labs, EKG, etc, we will make arrangements to do so.  Although advances in technology are sophisticated, we cannot ensure that it will always work on either your end or our end.  If the connection with a video visit is poor, we may have to switch to a telephone visit.  With either a video or telephone visit, we are not always able to ensure that we have a secure connection.   I need to obtain your verbal consent now.   Are you willing to proceed with your visit today?

## 2021-04-02 ENCOUNTER — Other Ambulatory Visit (HOSPITAL_COMMUNITY)
Admission: RE | Admit: 2021-04-02 | Discharge: 2021-04-02 | Disposition: A | Discharge status: Home / Self Care | Payer: BC Managed Care – PPO | Source: Ambulatory Visit | Attending: Internal Medicine | Admitting: Internal Medicine

## 2021-04-02 DIAGNOSIS — Z1211 Encounter for screening for malignant neoplasm of colon: Secondary | ICD-10-CM | POA: Insufficient documentation

## 2021-04-02 DIAGNOSIS — K2100 Gastro-esophageal reflux disease with esophagitis, without bleeding: Secondary | ICD-10-CM | POA: Diagnosis not present

## 2021-04-02 DIAGNOSIS — F458 Other somatoform disorders: Secondary | ICD-10-CM | POA: Diagnosis not present

## 2021-04-02 DIAGNOSIS — Z79899 Other long term (current) drug therapy: Secondary | ICD-10-CM | POA: Diagnosis not present

## 2021-04-02 DIAGNOSIS — Z87891 Personal history of nicotine dependence: Secondary | ICD-10-CM | POA: Diagnosis not present

## 2021-04-02 DIAGNOSIS — K648 Other hemorrhoids: Secondary | ICD-10-CM | POA: Diagnosis not present

## 2021-04-02 DIAGNOSIS — F32A Depression, unspecified: Secondary | ICD-10-CM | POA: Diagnosis not present

## 2021-04-02 LAB — PREGNANCY, URINE: Preg Test, Ur: NEGATIVE

## 2021-04-04 ENCOUNTER — Encounter (HOSPITAL_COMMUNITY)
Admission: RE | Disposition: A | Discharge status: Home / Self Care | Payer: Self-pay | Source: Home / Self Care | Attending: Internal Medicine

## 2021-04-04 ENCOUNTER — Ambulatory Visit (HOSPITAL_COMMUNITY): Payer: BC Managed Care – PPO | Admitting: Certified Registered Nurse Anesthetist

## 2021-04-04 ENCOUNTER — Ambulatory Visit (HOSPITAL_COMMUNITY): Admit: 2021-04-04 | Payer: MEDICAID

## 2021-04-04 ENCOUNTER — Ambulatory Visit (HOSPITAL_COMMUNITY)
Admission: RE | Admit: 2021-04-04 | Discharge: 2021-04-04 | Disposition: A | Discharge status: Home / Self Care | Payer: BC Managed Care – PPO | Attending: Internal Medicine | Admitting: Internal Medicine

## 2021-04-04 ENCOUNTER — Other Ambulatory Visit: Payer: Self-pay

## 2021-04-04 ENCOUNTER — Encounter (HOSPITAL_COMMUNITY): Payer: Self-pay

## 2021-04-04 DIAGNOSIS — R059 Cough, unspecified: Secondary | ICD-10-CM | POA: Diagnosis not present

## 2021-04-04 DIAGNOSIS — K648 Other hemorrhoids: Secondary | ICD-10-CM | POA: Diagnosis not present

## 2021-04-04 DIAGNOSIS — Z139 Encounter for screening, unspecified: Secondary | ICD-10-CM | POA: Diagnosis not present

## 2021-04-04 DIAGNOSIS — K2100 Gastro-esophageal reflux disease with esophagitis, without bleeding: Secondary | ICD-10-CM | POA: Diagnosis not present

## 2021-04-04 DIAGNOSIS — R053 Chronic cough: Secondary | ICD-10-CM | POA: Diagnosis not present

## 2021-04-04 DIAGNOSIS — Z1211 Encounter for screening for malignant neoplasm of colon: Secondary | ICD-10-CM | POA: Diagnosis not present

## 2021-04-04 DIAGNOSIS — K219 Gastro-esophageal reflux disease without esophagitis: Secondary | ICD-10-CM | POA: Diagnosis not present

## 2021-04-04 DIAGNOSIS — Z79899 Other long term (current) drug therapy: Secondary | ICD-10-CM | POA: Insufficient documentation

## 2021-04-04 DIAGNOSIS — R1310 Dysphagia, unspecified: Secondary | ICD-10-CM | POA: Diagnosis not present

## 2021-04-04 DIAGNOSIS — Z87891 Personal history of nicotine dependence: Secondary | ICD-10-CM | POA: Insufficient documentation

## 2021-04-04 DIAGNOSIS — F458 Other somatoform disorders: Secondary | ICD-10-CM | POA: Diagnosis not present

## 2021-04-04 DIAGNOSIS — F418 Other specified anxiety disorders: Secondary | ICD-10-CM | POA: Diagnosis not present

## 2021-04-04 DIAGNOSIS — F32A Depression, unspecified: Secondary | ICD-10-CM | POA: Insufficient documentation

## 2021-04-04 HISTORY — PX: COLONOSCOPY WITH PROPOFOL: SHX5780

## 2021-04-04 HISTORY — PX: ESOPHAGOGASTRODUODENOSCOPY (EGD) WITH PROPOFOL: SHX5813

## 2021-04-04 SURGERY — COLONOSCOPY WITH PROPOFOL
Anesthesia: General

## 2021-04-04 SURGERY — COLONOSCOPY WITH PROPOFOL
Anesthesia: Monitor Anesthesia Care

## 2021-04-04 MED ORDER — PROPOFOL 500 MG/50ML IV EMUL
INTRAVENOUS | Status: DC | PRN
  Administered 2021-04-04: 150 ug/kg/min via INTRAVENOUS

## 2021-04-04 MED ORDER — PROPOFOL 10 MG/ML IV BOLUS
INTRAVENOUS | Status: DC | PRN
  Administered 2021-04-04: 50 mg via INTRAVENOUS
  Administered 2021-04-04: 100 mg via INTRAVENOUS
  Administered 2021-04-04: 50 mg via INTRAVENOUS

## 2021-04-04 MED ORDER — LIDOCAINE HCL (CARDIAC) PF 100 MG/5ML IV SOSY
PREFILLED_SYRINGE | INTRAVENOUS | Status: DC | PRN
Start: 2021-04-04 — End: 2021-04-04
  Administered 2021-04-04: 50 mg via INTRAVENOUS

## 2021-04-04 MED ORDER — LACTATED RINGERS IV SOLN: INTRAVENOUS | Status: DC | PRN

## 2021-04-04 MED ORDER — LACTATED RINGERS IV SOLN
INTRAVENOUS | Status: DC
  Administered 2021-04-04: 1000 mL via INTRAVENOUS

## 2021-04-04 NOTE — Op Note (Signed)
[facility] Patient Name: Sara Sweeney Procedure Date: 04/04/2021 8:42 AM MRN: [id number] Date of Birth: Apr 18, 1975 Attending MD: Elon Alas. Edgar Frisk CSN: [id number] Age: 46 Admit Type: Outpatient Procedure:                Colonoscopy Indications:              Screening for colorectal malignant neoplasm Providers:                Elon Alas. Abbey Chatters, DO, Lambert Mody, Hughie Closs RN, RN, Randa Spike, Technician Referring MD:              Medicines:                See the Anesthesia note for documentation of the                            administered medications Complications:            No immediate complications. Estimated Blood Loss:     Estimated blood loss: none. Procedure:                Pre-Anesthesia Assessment:                           - The anesthesia plan was to use monitored                            anesthesia care (MAC).                           After obtaining informed consent, the colonoscope                            was passed under direct vision. Throughout the                            procedure, the patient's blood pressure, pulse, and                            oxygen saturations were monitored continuously. The                            [id number]-HQ190L ([id number]) scope was introduced through                            the anus and advanced to the the cecum, identified                            by appendiceal orifice and ileocecal valve. The                            colonoscopy was performed without difficulty. The                            patient tolerated the procedure  well. The quality                            of the bowel preparation was evaluated using the                            BBPS [facility] Bowel Preparation Scale) with scores                            of: Right Colon = 3, Transverse Colon = 3 and Left                            Colon = 3 (entire mucosa seen well with no residual                             staining, small fragments of stool or opaque                            liquid). The total BBPS score equals 9. Scope In: 8:44:11 AM Scope Out: 8:53:52 AM Scope Withdrawal Time: 0 hours 6 minutes 9 seconds  Total Procedure Duration: 0 hours 9 minutes 41 seconds  Findings:      The perianal and digital rectal examinations were normal.      Non-bleeding internal hemorrhoids were found during endoscopy.      The entire examined colon appeared normal. Impression:               - Non-bleeding internal hemorrhoids.                           - The entire examined colon is normal.                           - No specimens collected. Moderate Sedation:      Per Anesthesia Care Recommendation:           - Patient has a contact number available for                            emergencies. The signs and symptoms of potential                            delayed complications were discussed with the                            patient. Return to normal activities tomorrow.                            Written discharge instructions were provided to the                            patient.                           - Resume previous diet.                           -  Continue present medications.                           - Repeat colonoscopy in 10 years for screening                            purposes.                           - Return to GI clinic PRN. Procedure Code(s):        --- Professional ---                           E0814, Colorectal cancer screening; colonoscopy on                            individual not meeting criteria for high risk Diagnosis Code(s):        --- Professional ---                           Z12.11, Encounter for screening for malignant                            neoplasm of colon                           K64.8, Other hemorrhoids CPT copyright 2019 American Medical Association. All rights reserved. The codes documented in this report are preliminary and upon coder review  may  be revised to meet current compliance requirements. Elon Alas. Abbey Chatters, DO [place] Abbey Chatters, DO 04/04/2021 8:55:31 AM This report has been signed electronically. Number of Addenda: 0

## 2021-04-04 NOTE — Op Note (Signed)
[facility] Patient Name: Sara Sweeney Procedure Date: 04/04/2021 8:29 AM MRN: [id number] Date of Birth: 1975/06/29 Attending MD: Elon Alas. Abbey Chatters DO CSN: [id number] Age: 46 Admit Type: Outpatient Procedure:                Upper GI endoscopy Indications:              Chronic cough, Globus sensation Providers:                Elon Alas. Abbey Chatters, DO, Lambert Mody, Hughie Closs RN, RN, Randa Spike, Technician Referring MD:              Medicines:                See the Anesthesia note for documentation of the                            administered medications Complications:            No immediate complications. Estimated Blood Loss:     Estimated blood loss was minimal. Procedure:                Pre-Anesthesia Assessment:                           - The anesthesia plan was to use monitored                            anesthesia care (MAC).                           After obtaining informed consent, the endoscope was                            passed under direct vision. Throughout the                            procedure, the patient's blood pressure, pulse, and                            oxygen saturations were monitored continuously. The                            [id number]-H190 ([id number]) scope was introduced through the                            mouth, and advanced to the second part of duodenum.                            The upper GI endoscopy was accomplished without                            difficulty. The patient tolerated the procedure                            well.  Scope In: 8:36:39 AM Scope Out: 8:40:00 AM Total Procedure Duration: 0 hours 3 minutes 21 seconds  Findings:      The Z-line was regular and was found 35 cm from the incisors.      The entire examined stomach was normal.      The duodenal bulb, first portion of the duodenum and second portion of       the duodenum were normal.      Biopsies were taken with a cold  forceps in the middle third of the       esophagus for histology. Impression:               - Z-line regular, 35 cm from the incisors.                           - Normal stomach.                           - Normal duodenal bulb, first portion of the                            duodenum and second portion of the duodenum.                           - Biopsies were taken with a cold forceps for                            histology in the middle third of the esophagus. Moderate Sedation:      Per Anesthesia Care Recommendation:           - Patient has a contact number available for                            emergencies. The signs and symptoms of potential                            delayed complications were discussed with the                            patient. Return to normal activities tomorrow.                            Written discharge instructions were provided to the                            patient.                           - Resume previous diet.                           - Continue present medications.                           - Await pathology results.                           -  Use Nexium (esomeprazole) 40 mg PO daily.                           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 All rights reserved. The codes documented in this report are preliminary and upon coder review may  be revised to meet current compliance requirements. Elon Alas. Abbey Chatters, DO Faith Abbey Chatters,  DO 04/04/2021 8:43:31 AM This report has been signed electronically. Number of Addenda: 0

## 2021-04-04 NOTE — Anesthesia Postprocedure Evaluation (Signed)
Anesthesia Post Note  Patient: Sara Sweeney  Procedure(s) Performed: COLONOSCOPY WITH PROPOFOL ESOPHAGOGASTRODUODENOSCOPY (EGD) WITH PROPOFOL  Patient location during evaluation: Phase II Anesthesia Type: General Level of consciousness: awake Pain management: pain level controlled Vital Signs Assessment: post-procedure vital signs reviewed and stable Respiratory status: spontaneous breathing and respiratory function stable Cardiovascular status: blood pressure returned to baseline and stable Postop Assessment: no headache and no apparent nausea or vomiting Anesthetic complications: no Comments: Late entry   No notable events documented.   Last Vitals:  Vitals:   04/04/21 0740 04/04/21 0856  BP: 118/81 103/68  Pulse: 89   Resp: 11 17  Temp: (!) 36.4 C 36.4 C  SpO2: 99% 97%    Last Pain:  Vitals:   04/04/21 0856  TempSrc: Oral  PainSc: 0-No pain                 Windell Norfolk

## 2021-04-04 NOTE — Transfer of Care (Signed)
Immediate Anesthesia Transfer of Care Note  Patient: Sara Sweeney  Procedure(s) Performed: COLONOSCOPY WITH PROPOFOL ESOPHAGOGASTRODUODENOSCOPY (EGD) WITH PROPOFOL  Patient Location: Endoscopy Unit  Anesthesia Type:General  Level of Consciousness: awake  Airway & Oxygen Therapy: Patient Spontanous Breathing  Post-op Assessment: Report given to RN and Post -op Vital signs reviewed and stable  Post vital signs: Reviewed and stable  Last Vitals:  Vitals Value Taken Time  BP    Temp    Pulse    Resp    SpO2      Last Pain:  Vitals:   04/04/21 0834  TempSrc:   PainSc: 0-No pain      Patients Stated Pain Goal: 6 (04/04/21 0740)  Complications: No notable events documented.

## 2021-04-04 NOTE — Interval H&P Note (Signed)
History and Physical Interval Note:  04/04/2021 8:01 AM  Sara Sweeney  has presented today for surgery, with the diagnosis of Screening 1st TCS, chronic gerd, globus/ dysphagia.  The various methods of treatment have been discussed with the patient and family. After consideration of risks, benefits and other options for treatment, the patient has consented to  Procedure(s) with comments: COLONOSCOPY WITH PROPOFOL (N/A) - 8:30 / ASA 1 ESOPHAGOGASTRODUODENOSCOPY (EGD) WITH PROPOFOL (N/A) BALLOON DILATION (N/A) as a surgical intervention.  The patient's history has been reviewed, patient examined, no change in status, stable for surgery.  I have reviewed the patient's chart and labs.  Questions were answered to the patient's satisfaction.     Lanelle Bal

## 2021-04-04 NOTE — Anesthesia Preprocedure Evaluation (Signed)
Anesthesia Evaluation  Patient identified by MRN, date of birth, ID band Patient awake    Reviewed: Allergy & Precautions, H&P , NPO status , Patient's Chart, lab work & pertinent test results, reviewed documented beta blocker date and time   Airway Mallampati: II  TM Distance: >3 FB Neck ROM: full    Dental no notable dental hx.    Pulmonary neg pulmonary ROS, former smoker,    Pulmonary exam normal breath sounds clear to auscultation       Cardiovascular Exercise Tolerance: Good negative cardio ROS   Rhythm:regular Rate:Normal     Neuro/Psych  Headaches, PSYCHIATRIC DISORDERS Anxiety Depression  Neuromuscular disease    GI/Hepatic Neg liver ROS, GERD  Medicated,  Endo/Other  negative endocrine ROS  Renal/GU negative Renal ROS  negative genitourinary   Musculoskeletal   Abdominal   Peds  Hematology negative hematology ROS (+)   Anesthesia Other Findings   Reproductive/Obstetrics negative OB ROS                             Anesthesia Physical Anesthesia Plan  ASA: 2  Anesthesia Plan: General   Post-op Pain Management:    Induction:   PONV Risk Score and Plan: Propofol infusion  Airway Management Planned:   Additional Equipment:   Intra-op Plan:   Post-operative Plan:   Informed Consent: I have reviewed the patients History and Physical, chart, labs and discussed the procedure including the risks, benefits and alternatives for the proposed anesthesia with the patient or authorized representative who has indicated his/her understanding and acceptance.     Dental Advisory Given  Plan Discussed with: CRNA  Anesthesia Plan Comments:         Anesthesia Quick Evaluation

## 2021-04-04 NOTE — Discharge Instructions (Addendum)
EGD Discharge instructions Please read the instructions outlined below and refer to this sheet in the next few weeks. These discharge instructions provide you with general information on caring for yourself after you leave the hospital. Your doctor may also give you specific instructions. While your treatment has been planned according to the most current medical practices available, unavoidable complications occasionally occur. If you have any problems or questions after discharge, please call your doctor. ACTIVITY You may resume your regular activity but move at a slower pace for the next 24 hours.  Take frequent rest periods for the next 24 hours.  Walking will help expel (get rid of) the air and reduce the bloated feeling in your abdomen.  No driving for 24 hours (because of the anesthesia (medicine) used during the test).  You may shower.  Do not sign any important legal documents or operate any machinery for 24 hours (because of the anesthesia used during the test).  NUTRITION Drink plenty of fluids.  You may resume your normal diet.  Begin with a light meal and progress to your normal diet.  Avoid alcoholic beverages for 24 hours or as instructed by your caregiver.  MEDICATIONS You may resume your normal medications unless your caregiver tells you otherwise.  WHAT YOU CAN EXPECT TODAY You may experience abdominal discomfort such as a feeling of fullness or gas pains.  FOLLOW-UP Your doctor will discuss the results of your test with you.  SEEK IMMEDIATE MEDICAL ATTENTION IF ANY OF THE FOLLOWING OCCUR: Excessive nausea (feeling sick to your stomach) and/or vomiting.  Severe abdominal pain and distention (swelling).  Trouble swallowing.  Temperature over 101 F (37.8 C).  Rectal bleeding or vomiting of blood.    Colonoscopy Discharge Instructions  Read the instructions outlined below and refer to this sheet in the next few weeks. These discharge instructions provide you with  general information on caring for yourself after you leave the hospital. Your doctor may also give you specific instructions. While your treatment has been planned according to the most current medical practices available, unavoidable complications occasionally occur.   ACTIVITY You may resume your regular activity, but move at a slower pace for the next 24 hours.  Take frequent rest periods for the next 24 hours.  Walking will help get rid of the air and reduce the bloated feeling in your belly (abdomen).  No driving for 24 hours (because of the medicine (anesthesia) used during the test).   Do not sign any important legal documents or operate any machinery for 24 hours (because of the anesthesia used during the test).  NUTRITION Drink plenty of fluids.  You may resume your normal diet as instructed by your doctor.  Begin with a light meal and progress to your normal diet. Heavy or fried foods are harder to digest and may make you feel sick to your stomach (nauseated).  Avoid alcoholic beverages for 24 hours or as instructed.  MEDICATIONS You may resume your normal medications unless your doctor tells you otherwise.  WHAT YOU CAN EXPECT TODAY Some feelings of bloating in the abdomen.  Passage of more gas than usual.  Spotting of blood in your stool or on the toilet paper.  IF YOU HAD POLYPS REMOVED DURING THE COLONOSCOPY: No aspirin products for 7 days or as instructed.  No alcohol for 7 days or as instructed.  Eat a soft diet for the next 24 hours.  FINDING OUT THE RESULTS OF YOUR TEST Not all test results are available  during your visit. If your test results are not back during the visit, make an appointment with your caregiver to find out the results. Do not assume everything is normal if you have not heard from your caregiver or the medical facility. It is important for you to follow up on all of your test results.  SEEK IMMEDIATE MEDICAL ATTENTION IF: You have more than a spotting of  blood in your stool.  Your belly is swollen (abdominal distention).  You are nauseated or vomiting.  You have a temperature over 101.  You have abdominal pain or discomfort that is severe or gets worse throughout the day.   Your upper endoscopy was relatively unremarkable.  I did not see any evidence of inflammation in your stomach or small bowel.  I did take biopsies of your esophagus to rule out a condition called eosinophilic esophagitis which can cause chronic cough and/or difficulty swallowing.  Await pathology results, my office will contact you.  If symptoms do not improve on PPI therapy and biopsies are negative, would recommend ENT evaluation.  Your colonoscopy was relatively unremarkable.  I did not find any polyps or evidence of colon cancer.  I recommend repeating colonoscopy in 10 years for colon cancer screening purposes.   Follow-up with GI as needed.   I hope you have a great rest of your week!  Elon Alas. Abbey Chatters, D.O. Gastroenterology and Hepatology [facility]

## 2021-04-07 LAB — SURGICAL PATHOLOGY

## 2021-04-10 ENCOUNTER — Encounter (HOSPITAL_COMMUNITY): Payer: Self-pay | Admitting: Internal Medicine

## 2021-04-17 ENCOUNTER — Other Ambulatory Visit: Payer: Self-pay | Admitting: Family Medicine

## 2021-04-17 DIAGNOSIS — F320 Major depressive disorder, single episode, mild: Secondary | ICD-10-CM

## 2021-04-17 DIAGNOSIS — F411 Generalized anxiety disorder: Secondary | ICD-10-CM

## 2021-07-17 ENCOUNTER — Ambulatory Visit: Payer: BC Managed Care – PPO | Admitting: Gastroenterology

## 2021-07-27 ENCOUNTER — Other Ambulatory Visit: Payer: Self-pay | Admitting: Family Medicine

## 2021-07-27 DIAGNOSIS — F320 Major depressive disorder, single episode, mild: Secondary | ICD-10-CM

## 2021-07-27 DIAGNOSIS — F411 Generalized anxiety disorder: Secondary | ICD-10-CM

## 2021-07-28 ENCOUNTER — Encounter: Payer: Self-pay | Admitting: Family Medicine

## 2021-07-28 NOTE — Telephone Encounter (Signed)
Called to schedule appointment, left message on machine to call back. Letter sent.

## 2021-07-28 NOTE — Telephone Encounter (Signed)
30 days given ntbs  

## 2021-08-11 ENCOUNTER — Other Ambulatory Visit: Payer: Self-pay | Admitting: Family Medicine

## 2021-08-11 DIAGNOSIS — F320 Major depressive disorder, single episode, mild: Secondary | ICD-10-CM

## 2021-08-11 DIAGNOSIS — F411 Generalized anxiety disorder: Secondary | ICD-10-CM

## 2021-08-28 ENCOUNTER — Encounter: Payer: Self-pay | Admitting: Physician Assistant

## 2021-08-28 ENCOUNTER — Ambulatory Visit (INDEPENDENT_AMBULATORY_CARE_PROVIDER_SITE_OTHER): Payer: BC Managed Care – PPO | Admitting: Physician Assistant

## 2021-08-28 VITALS — BP 123/85 | HR 63 | Temp 98.0°F | Ht 63.0 in | Wt 163.6 lb

## 2021-08-28 DIAGNOSIS — J40 Bronchitis, not specified as acute or chronic: Secondary | ICD-10-CM | POA: Diagnosis not present

## 2021-08-28 MED ORDER — AMOXICILLIN 875 MG PO TABS: 875.0000 mg | ORAL_TABLET | Freq: Two times a day (BID) | ORAL | 0 refills | Status: AC

## 2021-08-28 MED ORDER — ALBUTEROL SULFATE HFA 108 (90 BASE) MCG/ACT IN AERS: 2.0000 | INHALATION_SPRAY | Freq: Four times a day (QID) | RESPIRATORY_TRACT | 0 refills | Status: AC | PRN

## 2021-08-28 NOTE — Patient Instructions (Signed)
  Acute Bronchitis, Adult  Acute bronchitis is when air tubes in the lungs (bronchi) suddenly get swollen. The condition can make it hard for you to breathe. In adults, acute bronchitis usually goes away within 2 weeks. A cough caused by bronchitis may last up to 3 weeks. Smoking, allergies, and asthma can make the condition worse. What are the causes? Germs that cause cold and flu (viruses). The most common cause of this condition is the virus that causes the common cold. Bacteria. Substances that bother (irritate) the lungs, including: Smoke from cigarettes and other types of tobacco. Dust and pollen. Fumes from chemicals, gases, or burned fuel. Indoor or outdoor air pollution. What increases the risk? A weak body's defense system. This is also called the immune system. Any condition that affects your lungs and breathing, such as asthma. What are the signs or symptoms? A cough. Coughing up clear, yellow, or green mucus. Making high-pitched whistling sounds when you breathe, most often when you breathe out (wheezing). Runny or stuffy nose. Having too much mucus in your lungs (chest congestion). Shortness of breath. Body aches. A sore throat. How is this treated? Acute bronchitis may go away over time without treatment. Your doctor may tell you to: Drink more fluids. This will help thin your mucus so it is easier to cough up. Use a device that gets medicine into your lungs (inhaler). Use a vaporizer or a humidifier. These are machines that add water to the air. This helps with coughing and poor breathing. Take a medicine that thins mucus and helps clear it from your lungs. Take a medicine that prevents or stops coughing. It is not common to take an antibiotic medicine for this condition. Follow these instructions at home:  Take over-the-counter and prescription medicines only as told by your doctor. Use an inhaler, vaporizer, or humidifier as told by your doctor. Take two  teaspoons (10 mL) of honey at bedtime. This helps lessen your coughing at night. Drink enough fluid to keep your pee (urine) pale yellow. Do not smoke or use any products that contain nicotine or tobacco. If you need help quitting, ask your doctor. Get a lot of rest. Return to your normal activities when your doctor says that it is safe. Keep all follow-up visits. How is this prevented?  Wash your hands often with soap and water for at least 20 seconds. If you cannot use soap and water, use hand sanitizer. Avoid contact with people who have cold symptoms. Try not to touch your mouth, nose, or eyes with your hands. Avoid breathing in smoke or chemical fumes. Make sure to get the flu shot every year. Contact a doctor if: Your symptoms do not get better in 2 weeks. You have trouble coughing up the mucus. Your cough keeps you awake at night. You have a fever. Get help right away if: You cough up blood. You have chest pain. You have very bad shortness of breath. You faint or keep feeling like you are going to faint. You have a very bad headache. Your fever or chills get worse. These symptoms may be an emergency. Get help right away. Call your local emergency services (911 in the [place] Do not wait to see if the symptoms will go away. Do not drive yourself to the hospital. Summary Acute bronchitis is when air tubes in the lungs (bronchi) suddenly get swollen. In adults, acute bronchitis usually goes away within 2 weeks. Drink more fluids. This will help thin your mucus so it   is easier to cough up. Take over-the-counter and prescription medicines only as told by your doctor. Contact a doctor if your symptoms do not improve after 2 weeks of treatment. This information is not intended to replace advice given to you by your health care provider. Make sure you discuss any questions you have with your health care provider. Document Revised: 06/26/2020 Document Reviewed: 06/26/2020 Elsevier  Patient Education  2023 [employer]  

## 2021-08-28 NOTE — Progress Notes (Signed)
  Subjective:     Patient ID: Sara Sweeney, female   DOB: June 02, 1975, 46 y.o.   MRN: [id number]  Cough Associated symptoms include postnasal drip and wheezing. Pertinent negatives include no ear pain, sore throat or shortness of breath.   Pt with cough, congestion, and wheeze x 1 week  Review of Systems  Constitutional: Negative.   HENT:  Positive for congestion and postnasal drip. Negative for ear pain, sinus pressure, sinus pain and sore throat.   Respiratory:  Positive for cough, chest tightness and wheezing. Negative for shortness of breath.   Cardiovascular: Negative.        Objective:   Physical Exam Vitals and nursing note reviewed.  Constitutional:      General: She is not in acute distress.    Appearance: Normal appearance. She is ill-appearing. She is not toxic-appearing.  HENT:     Right Ear: Tympanic membrane, ear canal and external ear normal.     Left Ear: Tympanic membrane, ear canal and external ear normal.     Mouth/Throat:     Mouth: Mucous membranes are moist.     Pharynx: Oropharynx is clear.  Cardiovascular:     Rate and Rhythm: Regular rhythm.     Heart sounds: Normal heart sounds.  Pulmonary:     Effort: Pulmonary effort is normal. No respiratory distress.     Breath sounds: Wheezing present. No rhonchi.        Assessment:     1. Bronchitis        Plan:     Pt to restart her PPI Amox rx Alb Inh rx Fluids Rest F/U prn PATP

## 2021-09-22 ENCOUNTER — Other Ambulatory Visit: Payer: Self-pay | Admitting: Family Medicine

## 2021-09-22 DIAGNOSIS — F320 Major depressive disorder, single episode, mild: Secondary | ICD-10-CM

## 2021-09-22 DIAGNOSIS — F411 Generalized anxiety disorder: Secondary | ICD-10-CM

## 2021-09-22 MED ORDER — ESCITALOPRAM OXALATE 10 MG PO TABS: 10.0000 mg | ORAL_TABLET | Freq: Every day | ORAL | 6 refills | Status: DC

## 2021-10-07 ENCOUNTER — Other Ambulatory Visit: Payer: Self-pay | Admitting: Family Medicine

## 2021-10-07 DIAGNOSIS — F320 Major depressive disorder, single episode, mild: Secondary | ICD-10-CM

## 2021-10-07 DIAGNOSIS — F411 Generalized anxiety disorder: Secondary | ICD-10-CM

## 2021-11-27 DIAGNOSIS — S61219A Laceration without foreign body of unspecified finger without damage to nail, initial encounter: Secondary | ICD-10-CM | POA: Diagnosis not present

## 2021-11-27 DIAGNOSIS — S61001A Unspecified open wound of right thumb without damage to nail, initial encounter: Secondary | ICD-10-CM | POA: Diagnosis not present

## 2021-11-27 DIAGNOSIS — W25XXXA Contact with sharp glass, initial encounter: Secondary | ICD-10-CM | POA: Diagnosis not present

## 2021-11-27 DIAGNOSIS — S61011A Laceration without foreign body of right thumb without damage to nail, initial encounter: Secondary | ICD-10-CM | POA: Diagnosis not present

## 2021-12-26 ENCOUNTER — Ambulatory Visit: Payer: BC Managed Care – PPO | Admitting: Family Medicine

## 2021-12-26 ENCOUNTER — Encounter: Payer: Self-pay | Admitting: Family Medicine

## 2021-12-26 VITALS — BP 129/84 | HR 67 | Temp 97.1°F | Ht 63.0 in | Wt 169.6 lb

## 2021-12-26 DIAGNOSIS — Z23 Encounter for immunization: Secondary | ICD-10-CM | POA: Diagnosis not present

## 2021-12-26 DIAGNOSIS — Z6830 Body mass index (BMI) 30.0-30.9, adult: Secondary | ICD-10-CM

## 2021-12-26 DIAGNOSIS — I10 Essential (primary) hypertension: Secondary | ICD-10-CM

## 2021-12-26 DIAGNOSIS — Z7689 Persons encountering health services in other specified circumstances: Secondary | ICD-10-CM

## 2021-12-26 MED ORDER — SEMAGLUTIDE-WEIGHT MANAGEMENT 1.7 MG/0.75ML ~~LOC~~ SOAJ: 1.7000 mg | SUBCUTANEOUS | 0 refills | Status: AC

## 2021-12-26 MED ORDER — SEMAGLUTIDE-WEIGHT MANAGEMENT 0.25 MG/0.5ML ~~LOC~~ SOAJ: 0.2500 mg | SUBCUTANEOUS | 0 refills | Status: AC

## 2021-12-26 MED ORDER — SEMAGLUTIDE-WEIGHT MANAGEMENT 0.5 MG/0.5ML ~~LOC~~ SOAJ: 0.5000 mg | SUBCUTANEOUS | 0 refills | Status: AC

## 2021-12-26 MED ORDER — SEMAGLUTIDE-WEIGHT MANAGEMENT 1 MG/0.5ML ~~LOC~~ SOAJ: 1.0000 mg | SUBCUTANEOUS | 0 refills | Status: AC

## 2021-12-26 MED ORDER — SEMAGLUTIDE-WEIGHT MANAGEMENT 2.4 MG/0.75ML ~~LOC~~ SOAJ: 2.4000 mg | SUBCUTANEOUS | 0 refills | Status: AC

## 2021-12-26 MED ORDER — HYDROCHLOROTHIAZIDE 25 MG PO TABS: 25.0000 mg | ORAL_TABLET | Freq: Every day | ORAL | 3 refills | Status: DC

## 2021-12-26 NOTE — Progress Notes (Signed)
Subjective:  Patient ID: Sara Sweeney, female    DOB: 07/28/1975, 46 y.o.   MRN: [id number]  Patient Care Team: Baruch Gouty, FNP as PCP - General (Family Medicine)   Chief Complaint:  Weight Loss   HPI: Sara Sweeney is a 46 y.o. female presenting on 12/26/2021 for Weight Loss   Pt presents today to discuss weight management. She has been dieting and exercising over the last 4 months without success. She has but back on carbs and is counting calories. No success. Her blood pressure has also been elevated over the last several months. Denies headaches, chest pain, leg swelling, shortness of breath, weakness, or confusion. Has not been on medications in the past.       Relevant past medical, surgical, family, and social history reviewed and updated as indicated.  Allergies and medications reviewed and updated. Data reviewed: Chart in Epic.   Past Medical History:  Diagnosis Date   Allergy    Anxiety    Migraine     Past Surgical History:  Procedure Laterality Date   ABLATION     COLONOSCOPY WITH PROPOFOL N/A 04/04/2021   Procedure: COLONOSCOPY WITH PROPOFOL;  Surgeon: Eloise Harman, DO;  Location: [facility];  Service: Endoscopy;  Laterality: N/A;  8:30 / ASA 1   ESOPHAGOGASTRODUODENOSCOPY  2008   Dr. Oneida Alar: small hiatal hernia   ESOPHAGOGASTRODUODENOSCOPY (EGD) WITH PROPOFOL N/A 04/04/2021   Procedure: ESOPHAGOGASTRODUODENOSCOPY (EGD) WITH PROPOFOL;  Surgeon: Eloise Harman, DO;  Location: [facility];  Service: Endoscopy;  Laterality: N/A;   FOOT SURGERY     FRACTURE SURGERY     HERNIA REPAIR     JOINT REPLACEMENT     TUBAL LIGATION      Social History   Socioeconomic History   Marital status: Single    Spouse name: Not on file   Number of children: Not on file   Years of education: Not on file   Highest education level: Not on file  Occupational History   Not on file  Tobacco Use   Smoking status: Former   Smokeless tobacco: Never   Vaping Use   Vaping Use: Never used  Substance and Sexual Activity   Alcohol use: Yes    Comment: weekends   Drug use: No   Sexual activity: Yes    Birth control/protection: Surgical  Other Topics Concern   Not on file  Social History Narrative   Not on file   Social Determinants of Health   Financial Resource Strain: Not on file  Food Insecurity: Not on file  Transportation Needs: Not on file  Physical Activity: Not on file  Stress: Not on file  Social Connections: Not on file  Intimate Partner Violence: Not on file    Outpatient Encounter Medications as of 12/26/2021  Medication Sig   albuterol (VENTOLIN HFA) 108 (90 Base) MCG/ACT inhaler Inhale 2 puffs into the lungs every 6 (six) hours as needed for wheezing or shortness of breath.   Cholecalciferol (VITAMIN D-3) 125 MCG (5000 UT) TABS Take 5,000 Units by mouth daily at 6 (six) AM.   escitalopram (LEXAPRO) 10 MG tablet TAKE 1 TABLET BY MOUTH EVERY DAY   esomeprazole (NEXIUM) 40 MG capsule Take 1 capsule (40 mg total) by mouth daily at 12 noon. (Patient taking differently: Take 40 mg by mouth daily.)   fexofenadine (ALLEGRA) 180 MG tablet Take 1 tablet (180 mg total) by mouth daily.   hydrochlorothiazide (HYDRODIURIL) 25  MG tablet Take 1 tablet (25 mg total) by mouth daily.   ibuprofen (ADVIL) 200 MG tablet Take 800 mg by mouth every 8 (eight) hours as needed for moderate pain.   Red Yeast Rice Extract (RED YEAST RICE PO) Take 2 capsules by mouth daily at 6 (six) AM.   Semaglutide-Weight Management 0.25 MG/0.5ML SOAJ Inject 0.25 mg into the skin once a week for 28 days.   [START ON 01/24/2022] Semaglutide-Weight Management 0.5 MG/0.5ML SOAJ Inject 0.5 mg into the skin once a week for 28 days.   [START ON 02/22/2022] Semaglutide-Weight Management 1 MG/0.5ML SOAJ Inject 1 mg into the skin once a week for 28 days.   [START ON 03/23/2022] Semaglutide-Weight Management 1.7 MG/0.75ML SOAJ Inject 1.7 mg into the skin once a week for  28 days.   [START ON 04/21/2022] Semaglutide-Weight Management 2.4 MG/0.75ML SOAJ Inject 2.4 mg into the skin once a week for 28 days.   vitamin C (ASCORBIC ACID) 500 MG tablet Take 500 mg by mouth daily.   [DISCONTINUED] esomeprazole (NEXIUM) 40 MG capsule Take 40 mg by mouth daily at 12 noon.   [DISCONTINUED] fexofenadine (ALLEGRA) 180 MG tablet Take 180 mg by mouth daily.   [DISCONTINUED] montelukast (SINGULAIR) 10 MG tablet Take 1 tablet (10 mg total) by mouth at bedtime.   No facility-administered encounter medications on file as of 12/26/2021.    No Known Allergies  Review of Systems  Constitutional:  Positive for unexpected weight change. Negative for activity change, appetite change, chills, diaphoresis, fatigue and fever.  HENT: Negative.    Eyes: Negative.  Negative for photophobia and visual disturbance.  Respiratory:  Negative for cough, chest tightness and shortness of breath.   Cardiovascular:  Negative for chest pain, palpitations and leg swelling.  Gastrointestinal:  Negative for blood in stool, constipation, diarrhea, nausea and vomiting.  Endocrine: Negative.   Genitourinary:  Negative for decreased urine volume, difficulty urinating, dysuria, frequency and urgency.  Musculoskeletal:  Negative for arthralgias and myalgias.  Skin: Negative.   Allergic/Immunologic: Negative.   Neurological:  Negative for dizziness, tremors, seizures, syncope, facial asymmetry, speech difficulty, weakness, light-headedness, numbness and headaches.  Hematological: Negative.   Psychiatric/Behavioral:  Negative for confusion, hallucinations, sleep disturbance and suicidal ideas.   All other systems reviewed and are negative.       Objective:  BP 129/84   Pulse 67   Temp (!) 97.1 F (36.2 C) (Temporal)   Ht 5' 3" (1.6 m)   Wt 169 lb 9.6 oz (76.9 kg)   SpO2 94%   BMI 30.04 kg/m    Wt Readings from Last 3 Encounters:  12/26/21 169 lb 9.6 oz (76.9 kg)  08/28/21 163 lb 9.6 oz (74.2  kg)  04/04/21 160 lb (72.6 kg)    Physical Exam Vitals and nursing note reviewed.  Constitutional:      General: She is not in acute distress.    Appearance: Normal appearance. She is well-developed and well-groomed. She is obese. She is not ill-appearing, toxic-appearing or diaphoretic.  HENT:     Head: Normocephalic and atraumatic.     Jaw: There is normal jaw occlusion.     Right Ear: Hearing normal.     Left Ear: Hearing normal.     Nose: Nose normal.     Mouth/Throat:     Lips: Pink.     Mouth: Mucous membranes are moist.     Pharynx: Oropharynx is clear. Uvula midline.  Eyes:     General: Lids are  normal.     Extraocular Movements: Extraocular movements intact.     Conjunctiva/sclera: Conjunctivae normal.     Pupils: Pupils are equal, round, and reactive to light.  Neck:     Thyroid: No thyroid mass, thyromegaly or thyroid tenderness.     Vascular: No carotid bruit or JVD.     Trachea: Trachea and phonation normal.  Cardiovascular:     Rate and Rhythm: Normal rate and regular rhythm.     Chest Wall: PMI is not displaced.     Pulses: Normal pulses.     Heart sounds: Normal heart sounds. No murmur heard.    No friction rub. No gallop.  Pulmonary:     Effort: Pulmonary effort is normal. No respiratory distress.     Breath sounds: Normal breath sounds. No wheezing.  Abdominal:     General: Bowel sounds are normal. There is no distension or abdominal bruit.     Palpations: Abdomen is soft. There is no hepatomegaly or splenomegaly.     Tenderness: There is no abdominal tenderness. There is no right CVA tenderness or left CVA tenderness.     Hernia: No hernia is present.  Musculoskeletal:        General: Normal range of motion.     Cervical back: Normal range of motion and neck supple.     Right lower leg: No edema.     Left lower leg: No edema.  Lymphadenopathy:     Cervical: No cervical adenopathy.  Skin:    General: Skin is warm and dry.     Capillary Refill:  Capillary refill takes less than 2 seconds.     Coloration: Skin is not cyanotic, jaundiced or pale.     Findings: No rash.  Neurological:     General: No focal deficit present.     Mental Status: She is alert and oriented to person, place, and time.     Sensory: Sensation is intact.     Motor: Motor function is intact.     Coordination: Coordination is intact.     Gait: Gait is intact.     Deep Tendon Reflexes: Reflexes are normal and symmetric.  Psychiatric:        Attention and Perception: Attention and perception normal.        Mood and Affect: Mood and affect normal.        Speech: Speech normal.        Behavior: Behavior normal. Behavior is cooperative.        Thought Content: Thought content normal.        Cognition and Memory: Cognition and memory normal.        Judgment: Judgment normal.     Results for orders placed or performed during the hospital encounter of 04/04/21  Surgical pathology  Result Value Ref Range   SURGICAL PATHOLOGY      SURGICAL PATHOLOGY CASE: [id number] PATIENT: Haydee Salter Surgical Pathology Report     Clinical History: screening 1st TCS, chronic GERD, globus/dysphagia     FINAL MICROSCOPIC DIAGNOSIS:  A. ESOPHAGUS, MID, BIOPSY: - Esophageal squamous mucosa with mild vascular congestion, and focal squamous ballooning, suggestive of mild reflux esophagitis - Negative for increased intraepithelial eosinophils       GROSS DESCRIPTION:  Received in formalin are tan, soft tissue fragments that are submitted in toto. Number: 2 size: 0.2 cm, each blocks: 1 (KW, 04/04/2021)    Final Diagnosis performed by Jaquita Folds, MD.   Electronically signed 04/07/2021 Technical component performed  at [facility], [place] [address] [place], [place] [address].  Professional component performed at [facility], [place] [address] [place], [place] [address].  Immunohistochemistry Technical component (if  applicable) was performed at [employer]. [address], [place], [place], [place] [address].   IMMUNOHISTOCHEMISTRY DISCLAIMER (if applicable): Some of these immunohistochemical stains may have been developed and the performance characteristics determine by [facility]. Some may not have been cleared or approved by the U.S. Food and Drug Administration. The FDA has determined that such clearance or approval is not necessary. This test is used for clinical purposes. It should not be regarded as investigational or for research. This laboratory is certified under the [place] (CLIA-88) as qualified to perform high complexity clinical laboratory testing.  The controls stained appropriately.        Pertinent labs & imaging results that were available during my care of the patient were reviewed by me and considered in my medical decision making.  Assessment & Plan:  Perrin was seen today for weight loss.  Diagnoses and all orders for this visit:  Primary hypertension DASH diet and exercise encouraged. Labs pending. Will start below. Aware to report high or low readings. Follow up in 8 weeks for reevaluation.  -     CBC with Differential/Platelet -     CMP14+EGFR -     Lipid panel -     Thyroid Panel With TSH -     hydrochlorothiazide (HYDRODIURIL) 25 MG tablet; Take 1 tablet (25 mg total) by mouth daily.  BMI 30.0-30.9,adult Encounter for weight management No personal or family history of TMC or pancreatic disease. Labs pending. Has tried and diet and exercise without success. Will start Wegovy. Diet and exercise encouraged.  -     CBC with Differential/Platelet -     CMP14+EGFR -     Lipid panel -     Thyroid Panel With TSH -     Semaglutide-Weight Management 0.25 MG/0.5ML SOAJ; Inject 0.25 mg into the skin once a week for 28 days. -     Semaglutide-Weight Management 0.5 MG/0.5ML SOAJ; Inject 0.5 mg into the  skin once a week for 28 days. -     Semaglutide-Weight Management 1 MG/0.5ML SOAJ; Inject 1 mg into the skin once a week for 28 days. -     Semaglutide-Weight Management 1.7 MG/0.75ML SOAJ; Inject 1.7 mg into the skin once a week for 28 days. -     Semaglutide-Weight Management 2.4 MG/0.75ML SOAJ; Inject 2.4 mg into the skin once a week for 28 days.  Need for immunization against influenza -     Flu Vaccine QUAD 67moIM (Fluarix, Fluzone & Alfiuria Quad PF)     Continue all other maintenance medications.  Follow up plan: Return in about 6 weeks (around 02/06/2022) for BMI.   Continue healthy lifestyle choices, including diet (rich in fruits, vegetables, and lean proteins, and low in salt and simple carbohydrates) and exercise (at least 30 minutes of moderate physical activity daily).  Educational handout given for calorie counting for weight management  The above assessment and management plan was discussed with the patient. The patient verbalized understanding of and has agreed to the management plan. Patient is aware to call the clinic if they develop any new symptoms or if symptoms persist or worsen. Patient is aware when to return to the clinic for a follow-up visit. Patient educated on when it is appropriate  to go to the emergency department.   Monia Pouch, FNP-C [facility] [phone]

## 2021-12-26 NOTE — Patient Instructions (Addendum)
Here is a guide to help [place] find out which weight loss medications will be covered by your insurance plan.  Please check out this web site  [email or web address] and follow the 3 simple steps.   There is also a phone number you can call if you do not have access to the Internet. [phone] (Monday- Friday 8am-8pm)  [employer] provides coverage information for more than 80% of the inquiries submitted!!     Tips for success with Ozempic/Wegovy (and by success, how not to be super sick on your stomach): Eat small meals AVOID heavy foods (fried/ high in carbs like bread, pasta, rice) AVOID carbonated beverages (soda/ beer, as these can increase bloating) DOUBLE your water intake (will help you avoid constipation/ dehydration)   Ozempic/Wegovy CAN cause: Nausea Abdominal pain Increased acid reflux (sometimes presents as "sour burps") Constipation OR Diarrhea Fatigue (especially when you first start it)

## 2021-12-27 LAB — CBC WITH DIFFERENTIAL/PLATELET
Basophils Absolute: 0 10*3/uL (ref 0.0–0.2)
Basos: 0 %
EOS (ABSOLUTE): 0.1 10*3/uL (ref 0.0–0.4)
Eos: 2 %
Hematocrit: 39 % (ref 34.0–46.6)
Hemoglobin: 13.3 g/dL (ref 11.1–15.9)
Immature Grans (Abs): 0 10*3/uL (ref 0.0–0.1)
Immature Granulocytes: 0 %
Lymphocytes Absolute: 2.1 10*3/uL (ref 0.7–3.1)
Lymphs: 30 %
MCH: 31.1 pg (ref 26.6–33.0)
MCHC: 34.1 g/dL (ref 31.5–35.7)
MCV: 91 fL (ref 79–97)
Monocytes Absolute: 0.5 10*3/uL (ref 0.1–0.9)
Monocytes: 8 %
Neutrophils Absolute: 4.2 10*3/uL (ref 1.4–7.0)
Neutrophils: 60 %
Platelets: 275 10*3/uL (ref 150–450)
RBC: 4.27 x10E6/uL (ref 3.77–5.28)
RDW: 12.2 % (ref 11.7–15.4)
WBC: 7 10*3/uL (ref 3.4–10.8)

## 2021-12-27 LAB — LIPID PANEL
Chol/HDL Ratio: 3.9 ratio (ref 0.0–4.4)
Cholesterol, Total: 229 mg/dL — ABNORMAL HIGH (ref 100–199)
HDL: 59 mg/dL (ref >39)
LDL Chol Calc (NIH): 148 mg/dL — ABNORMAL HIGH (ref 0–99)
Triglycerides: 125 mg/dL (ref 0–149)
VLDL Cholesterol Cal: 22 mg/dL (ref 5–40)

## 2021-12-27 LAB — CMP14+EGFR
ALT: 23 IU/L (ref 0–32)
AST: 20 IU/L (ref 0–40)
Albumin/Globulin Ratio: 1.6 (ref 1.2–2.2)
Albumin: 4.2 g/dL (ref 3.9–4.9)
Alkaline Phosphatase: 83 IU/L (ref 44–121)
BUN/Creatinine Ratio: 15 (ref 9–23)
BUN: 12 mg/dL (ref 6–24)
Bilirubin Total: 0.3 mg/dL (ref 0.0–1.2)
CO2: 20 mmol/L (ref 20–29)
Calcium: 9 mg/dL (ref 8.7–10.2)
Chloride: 103 mmol/L (ref 96–106)
Creatinine, Ser: 0.8 mg/dL (ref 0.57–1.00)
Globulin, Total: 2.7 g/dL (ref 1.5–4.5)
Glucose: 87 mg/dL (ref 70–99)
Potassium: 4.1 mmol/L (ref 3.5–5.2)
Sodium: 137 mmol/L (ref 134–144)
Total Protein: 6.9 g/dL (ref 6.0–8.5)
eGFR: 91 mL/min/{1.73_m2} (ref >59)

## 2021-12-27 LAB — THYROID PANEL WITH TSH
Free Thyroxine Index: 1.8 (ref 1.2–4.9)
T3 Uptake Ratio: 25 % (ref 24–39)
T4, Total: 7 ug/dL (ref 4.5–12.0)
TSH: 2.08 u[IU]/mL (ref 0.450–4.500)

## 2022-02-18 ENCOUNTER — Other Ambulatory Visit: Payer: Self-pay | Admitting: Family Medicine

## 2022-02-18 DIAGNOSIS — R053 Chronic cough: Secondary | ICD-10-CM

## 2022-04-06 ENCOUNTER — Emergency Department (HOSPITAL_COMMUNITY): Admission: EM | Admit: 2022-04-06 | Discharge: 2022-04-06 | Discharge status: Against Medical Advice | Payer: MEDICAID

## 2022-04-06 NOTE — ED Notes (Signed)
The patient has left the facility

## 2022-04-07 ENCOUNTER — Other Ambulatory Visit: Payer: Self-pay | Admitting: Family Medicine

## 2022-04-07 ENCOUNTER — Telehealth: Payer: Self-pay

## 2022-04-07 DIAGNOSIS — F411 Generalized anxiety disorder: Secondary | ICD-10-CM

## 2022-04-07 DIAGNOSIS — F320 Major depressive disorder, single episode, mild: Secondary | ICD-10-CM

## 2022-04-07 NOTE — Telephone Encounter (Signed)
Transition Care Management Follow-up Telephone Call Date of discharge and from where: [facility] 04/06/2022 How have you been since you were released from the hospital? Still sick Any questions or concerns? No  Items Reviewed: Did the pt receive and understand the discharge instructions provided? Yes  Medications obtained and verified? Yes  Other? No  Any new allergies since your discharge? No  Dietary orders reviewed? Yes Do you have support at home? Yes   Home Care and Equipment/Supplies: Were home health services ordered? no If so, what is the name of the agency? N/a  Has the agency set up a time to come to the patient's home? not applicable Were any new equipment or medical supplies ordered?  No What is the name of the medical supply agency? N/a Were you able to get the supplies/equipment? no Do you have any questions related to the use of the equipment or supplies? No  Functional Questionnaire: (I = Independent and D = Dependent) ADLs: I  Bathing/Dressing- I  Meal Prep- I  Eating- I  Maintaining continence- I  Transferring/Ambulation- I  Managing Meds- I  Follow up appointments reviewed:  PCP Hospital f/u appt confirmed? No  Patient declined appt [facility] f/u appt confirmed? No   Are transportation arrangements needed? No  If their condition worsens, is the pt aware to call PCP or go to the Emergency Dept.? Yes Was the patient provided with contact information for the PCP's office or ED? Yes Was to pt encouraged to call back with questions or concerns? Yes Juanda Crumble, LPN [place] Direct Dial [phone]

## 2022-04-09 ENCOUNTER — Other Ambulatory Visit: Payer: Self-pay | Admitting: Family Medicine

## 2022-04-09 DIAGNOSIS — R053 Chronic cough: Secondary | ICD-10-CM

## 2022-04-09 DIAGNOSIS — J301 Allergic rhinitis due to pollen: Secondary | ICD-10-CM

## 2022-04-10 ENCOUNTER — Other Ambulatory Visit: Payer: Self-pay | Admitting: Family Medicine

## 2022-04-10 DIAGNOSIS — J301 Allergic rhinitis due to pollen: Secondary | ICD-10-CM

## 2022-04-10 DIAGNOSIS — R053 Chronic cough: Secondary | ICD-10-CM

## 2022-04-10 MED ORDER — MONTELUKAST SODIUM 10 MG PO TABS: 10.0000 mg | ORAL_TABLET | Freq: Every day | ORAL | 0 refills | Status: DC

## 2022-04-17 ENCOUNTER — Other Ambulatory Visit: Payer: Self-pay | Admitting: Family Medicine

## 2022-04-17 DIAGNOSIS — N3001 Acute cystitis with hematuria: Secondary | ICD-10-CM

## 2022-04-17 MED ORDER — SULFAMETHOXAZOLE-TRIMETHOPRIM 800-160 MG PO TABS: 1.0000 | ORAL_TABLET | Freq: Two times a day (BID) | ORAL | 0 refills | Status: AC

## 2022-08-27 ENCOUNTER — Other Ambulatory Visit: Payer: Self-pay | Admitting: Family Medicine

## 2022-08-27 DIAGNOSIS — F320 Major depressive disorder, single episode, mild: Secondary | ICD-10-CM

## 2022-08-27 DIAGNOSIS — R053 Chronic cough: Secondary | ICD-10-CM

## 2022-08-27 DIAGNOSIS — F411 Generalized anxiety disorder: Secondary | ICD-10-CM

## 2022-08-27 DIAGNOSIS — J301 Allergic rhinitis due to pollen: Secondary | ICD-10-CM

## 2022-09-10 ENCOUNTER — Other Ambulatory Visit: Payer: Self-pay | Admitting: Family Medicine

## 2022-09-10 DIAGNOSIS — F411 Generalized anxiety disorder: Secondary | ICD-10-CM

## 2022-09-10 DIAGNOSIS — F320 Major depressive disorder, single episode, mild: Secondary | ICD-10-CM

## 2022-12-01 ENCOUNTER — Other Ambulatory Visit: Payer: Self-pay | Admitting: Family Medicine

## 2022-12-01 DIAGNOSIS — F320 Major depressive disorder, single episode, mild: Secondary | ICD-10-CM

## 2022-12-01 DIAGNOSIS — F411 Generalized anxiety disorder: Secondary | ICD-10-CM

## 2022-12-01 MED ORDER — ESCITALOPRAM OXALATE 10 MG PO TABS: 10.0000 mg | ORAL_TABLET | Freq: Every day | ORAL | 3 refills | Status: DC

## 2022-12-08 ENCOUNTER — Encounter: Payer: Self-pay | Admitting: Family Medicine

## 2022-12-08 DIAGNOSIS — F411 Generalized anxiety disorder: Secondary | ICD-10-CM

## 2022-12-08 DIAGNOSIS — F320 Major depressive disorder, single episode, mild: Secondary | ICD-10-CM

## 2022-12-08 MED ORDER — ESCITALOPRAM OXALATE 10 MG PO TABS
10.0000 mg | ORAL_TABLET | Freq: Every day | ORAL | 3 refills | Status: DC
Start: 2022-12-08 — End: 2023-01-06

## 2023-01-06 ENCOUNTER — Other Ambulatory Visit: Payer: Self-pay | Admitting: Family Medicine

## 2023-01-06 DIAGNOSIS — F411 Generalized anxiety disorder: Secondary | ICD-10-CM

## 2023-01-06 DIAGNOSIS — F320 Major depressive disorder, single episode, mild: Secondary | ICD-10-CM

## 2023-01-06 MED ORDER — ESCITALOPRAM OXALATE 10 MG PO TABS
10.0000 mg | ORAL_TABLET | Freq: Every day | ORAL | 0 refills | Status: DC
Start: 2023-01-06 — End: 2023-01-13

## 2023-01-06 NOTE — Telephone Encounter (Signed)
Sara Sweeney. NTBS Last OV 12/26/21 30 days given 12/08/22.

## 2023-01-06 NOTE — Addendum Note (Signed)
Addended by: Julious Payer D on: 01/06/2023 11:23 AM   Modules accepted: Orders

## 2023-01-06 NOTE — Telephone Encounter (Signed)
Apt scheduled for 01/14/2023

## 2023-01-13 ENCOUNTER — Encounter: Payer: Self-pay | Admitting: Family Medicine

## 2023-01-13 ENCOUNTER — Ambulatory Visit: Payer: No Typology Code available for payment source | Admitting: Family Medicine

## 2023-01-13 VITALS — BP 120/79 | HR 64 | Temp 97.6°F | Ht 63.0 in | Wt 145.8 lb

## 2023-01-13 DIAGNOSIS — B3731 Acute candidiasis of vulva and vagina: Secondary | ICD-10-CM

## 2023-01-13 DIAGNOSIS — E559 Vitamin D deficiency, unspecified: Secondary | ICD-10-CM

## 2023-01-13 DIAGNOSIS — I10 Essential (primary) hypertension: Secondary | ICD-10-CM

## 2023-01-13 DIAGNOSIS — F320 Major depressive disorder, single episode, mild: Secondary | ICD-10-CM | POA: Diagnosis not present

## 2023-01-13 DIAGNOSIS — J301 Allergic rhinitis due to pollen: Secondary | ICD-10-CM

## 2023-01-13 DIAGNOSIS — F411 Generalized anxiety disorder: Secondary | ICD-10-CM | POA: Diagnosis not present

## 2023-01-13 DIAGNOSIS — R053 Chronic cough: Secondary | ICD-10-CM

## 2023-01-13 DIAGNOSIS — Z1231 Encounter for screening mammogram for malignant neoplasm of breast: Secondary | ICD-10-CM

## 2023-01-13 MED ORDER — FLUCONAZOLE 150 MG PO TABS: ORAL_TABLET | ORAL | 0 refills | Status: DC

## 2023-01-13 MED ORDER — FEXOFENADINE HCL 180 MG PO TABS: 180.0000 mg | ORAL_TABLET | Freq: Every day | ORAL | 4 refills | Status: DC

## 2023-01-13 MED ORDER — HYDROCHLOROTHIAZIDE 25 MG PO TABS: 25.0000 mg | ORAL_TABLET | Freq: Every day | ORAL | 4 refills | Status: DC

## 2023-01-13 MED ORDER — MONTELUKAST SODIUM 10 MG PO TABS: 10.0000 mg | ORAL_TABLET | Freq: Every day | ORAL | 4 refills | Status: DC

## 2023-01-13 MED ORDER — ESCITALOPRAM OXALATE 10 MG PO TABS: 10.0000 mg | ORAL_TABLET | Freq: Every day | ORAL | 4 refills | Status: DC

## 2023-01-13 NOTE — Progress Notes (Signed)
Subjective:  Patient ID: Sara Sweeney, female    DOB: 02/04/1976, 47 y.o.   MRN: [id number]  Patient Care Team: Sonny Masters, FNP as PCP - General (Family Medicine)   Chief Complaint:  Medical Management of Chronic Issues   HPI: Sara Sweeney is a 47 y.o. female presenting on 01/13/2023 for Medical Management of Chronic Issues   Discussed the use of AI scribe software for clinical note transcription with the patient, who gave verbal consent to proceed.  History of Present Illness   The patient presents with a chief complaint of insomnia, reporting difficulty falling asleep and staying asleep. They have tried L-tanin without success and are currently taking a combination of 3 mg fast-acting and 10 mg long-acting medications, but still struggle with sleep.  In addition to insomnia, the patient has a history of weight loss due to the use of Ozempic injections, which they stopped in July. At the time of cessation, they weighed 128 lbs, down from an initial weight of 169 lbs. The patient reports dissatisfaction with the weight loss, noting a loss of muscle mass in the buttocks and thighs. Since stopping the injections, they believe they have maintained a stable weight.  The patient also reports a need for a refill of Singulair, which they take nightly. They have not had any recent lab work done, with the last tests being over a year ago. They also mention a need for a mammogram, which has not been done in a year or two. The patient has a history of fibrocystic changes in the breasts.  Lastly, the patient is on Lexapro, which they report is working well for them. They have not mentioned any new symptoms or changes in their health status since the last consultation.           01/13/2023    4:12 PM 12/26/2021   10:04 AM 08/28/2021    3:58 PM 01/17/2021   11:16 AM  Depression screen PHQ 2/9  Decreased Interest 0 0 0 0  Down, Depressed, Hopeless 0 0 0 1  PHQ - 2 Score 0 0 0 1   Altered sleeping 3 0  1  Tired, decreased energy 2 2  1   Change in appetite 0 3  1  Feeling bad or failure about yourself  0 0  0  Trouble concentrating 2 0  1  Moving slowly or fidgety/restless 0 0  0  Suicidal thoughts 0 0  0  PHQ-9 Score 7 5  5   Difficult doing work/chores Somewhat difficult Not difficult at all  Not difficult at all      01/13/2023    4:13 PM 12/26/2021   10:04 AM 01/17/2021   11:17 AM  GAD 7 : Generalized Anxiety Score  Nervous, Anxious, on Edge 2 0 1  Control/stop worrying 2 0 1  Worry too much - different things 2 0 1  Trouble relaxing 2 0 1  Restless 2 0 1  Easily annoyed or irritable 2 0 1  Afraid - awful might happen 0 0 0  Total GAD 7 Score 12 0 6  Anxiety Difficulty Somewhat difficult Not difficult at all Not difficult at all      Relevant past medical, surgical, family, and social history reviewed and updated as indicated.  Allergies and medications reviewed and updated. Data reviewed: Chart in Epic.   Past Medical History:  Diagnosis Date   Allergy    Anxiety    Migraine  Past Surgical History:  Procedure Laterality Date   ABLATION     COLONOSCOPY WITH PROPOFOL N/A 04/04/2021   Procedure: COLONOSCOPY WITH PROPOFOL;  Surgeon: Lanelle Bal, DO;  Location: [facility];  Service: Endoscopy;  Laterality: N/A;  8:30 / ASA 1   ESOPHAGOGASTRODUODENOSCOPY  2008   Dr. Darrick Penna: small hiatal hernia   ESOPHAGOGASTRODUODENOSCOPY (EGD) WITH PROPOFOL N/A 04/04/2021   Procedure: ESOPHAGOGASTRODUODENOSCOPY (EGD) WITH PROPOFOL;  Surgeon: Lanelle Bal, DO;  Location: [facility];  Service: Endoscopy;  Laterality: N/A;   FOOT SURGERY     FRACTURE SURGERY     HERNIA REPAIR     JOINT REPLACEMENT     TUBAL LIGATION      Social History   Socioeconomic History   Marital status: Single    Spouse name: Not on file   Number of children: Not on file   Years of education: Not on file   Highest education level: Not on file  Occupational  History   Not on file  Tobacco Use   Smoking status: Former   Smokeless tobacco: Never  Vaping Use   Vaping status: Never Used  Substance and Sexual Activity   Alcohol use: Yes    Comment: weekends   Drug use: No   Sexual activity: Yes    Birth control/protection: Surgical  Other Topics Concern   Not on file  Social History Narrative   Not on file   Social Determinants of Health   Financial Resource Strain: Not on file  Food Insecurity: Not on file  Transportation Needs: Not on file  Physical Activity: Not on file  Stress: Not on file  Social Connections: Not on file  Intimate Partner Violence: Not on file    Outpatient Encounter Medications as of 01/13/2023  Medication Sig   fluconazole (DIFLUCAN) 150 MG tablet 1 po q week x 4 weeks   albuterol (VENTOLIN HFA) 108 (90 Base) MCG/ACT inhaler Inhale 2 puffs into the lungs every 6 (six) hours as needed for wheezing or shortness of breath.   Cholecalciferol (VITAMIN D-3) 125 MCG (5000 UT) TABS Take 5,000 Units by mouth daily at 6 (six) AM.   escitalopram (LEXAPRO) 10 MG tablet Take 1 tablet (10 mg total) by mouth daily.   esomeprazole (NEXIUM) 40 MG capsule Take 1 capsule (40 mg total) by mouth daily at 12 noon. (Patient taking differently: Take 40 mg by mouth daily.)   fexofenadine (ALLEGRA) 180 MG tablet Take 1 tablet (180 mg total) by mouth daily.   hydrochlorothiazide (HYDRODIURIL) 25 MG tablet Take 1 tablet (25 mg total) by mouth daily.   ibuprofen (ADVIL) 200 MG tablet Take 800 mg by mouth every 8 (eight) hours as needed for moderate pain.   montelukast (SINGULAIR) 10 MG tablet Take 1 tablet (10 mg total) by mouth at bedtime.   Red Yeast Rice Extract (RED YEAST RICE PO) Take 2 capsules by mouth daily at 6 (six) AM.   vitamin C (ASCORBIC ACID) 500 MG tablet Take 500 mg by mouth daily.   [DISCONTINUED] escitalopram (LEXAPRO) 10 MG tablet Take 1 tablet (10 mg total) by mouth daily.   [DISCONTINUED] esomeprazole (NEXIUM) 40 MG  capsule Take 40 mg by mouth daily at 12 noon.   [DISCONTINUED] fexofenadine (ALLEGRA) 180 MG tablet Take 180 mg by mouth daily.   [DISCONTINUED] fexofenadine (ALLEGRA) 180 MG tablet Take 1 tablet (180 mg total) by mouth daily.   [DISCONTINUED] hydrochlorothiazide (HYDRODIURIL) 25 MG tablet Take 1 tablet (25 mg total)  by mouth daily.   [DISCONTINUED] montelukast (SINGULAIR) 10 MG tablet TAKE 1 TABLET BY MOUTH EVERYDAY AT BEDTIME   No facility-administered encounter medications on file as of 01/13/2023.    No Known Allergies  Pertinent ROS per HPI, otherwise unremarkable      Objective:  BP 120/79   Pulse 64   Temp 97.6 F (36.4 C)   Ht 5\' 3"  (1.6 m)   Wt 145 lb 12.8 oz (66.1 kg)   SpO2 98%   BMI 25.83 kg/m    Wt Readings from Last 3 Encounters:  01/13/23 145 lb 12.8 oz (66.1 kg)  12/26/21 169 lb 9.6 oz (76.9 kg)  08/28/21 163 lb 9.6 oz (74.2 kg)    Physical Exam Vitals and nursing note reviewed.  Constitutional:      General: She is not in acute distress.    Appearance: Normal appearance. She is well-developed and well-groomed. She is not ill-appearing, toxic-appearing or diaphoretic.  HENT:     Head: Normocephalic and atraumatic.     Jaw: There is normal jaw occlusion.     Right Ear: Hearing normal.     Left Ear: Hearing normal.     Nose: Nose normal.     Mouth/Throat:     Lips: Pink.     Mouth: Mucous membranes are moist.     Pharynx: Oropharynx is clear. Uvula midline.  Eyes:     General: Lids are normal.     Extraocular Movements: Extraocular movements intact.     Conjunctiva/sclera: Conjunctivae normal.     Pupils: Pupils are equal, round, and reactive to light.  Neck:     Thyroid: No thyroid mass, thyromegaly or thyroid tenderness.     Vascular: No carotid bruit or JVD.     Trachea: Trachea and phonation normal.  Cardiovascular:     Rate and Rhythm: Normal rate and regular rhythm.     Chest Wall: PMI is not displaced.     Pulses: Normal pulses.      Heart sounds: Normal heart sounds. No murmur heard.    No friction rub. No gallop.  Pulmonary:     Effort: Pulmonary effort is normal. No respiratory distress.     Breath sounds: Normal breath sounds. No wheezing.  Abdominal:     General: Bowel sounds are normal. There is no distension or abdominal bruit.     Palpations: Abdomen is soft. There is no hepatomegaly or splenomegaly.     Tenderness: There is no abdominal tenderness. There is no right CVA tenderness or left CVA tenderness.     Hernia: No hernia is present.  Musculoskeletal:        General: Normal range of motion.     Cervical back: Normal range of motion and neck supple.     Right lower leg: No edema.     Left lower leg: No edema.  Lymphadenopathy:     Cervical: No cervical adenopathy.  Skin:    General: Skin is warm and dry.     Capillary Refill: Capillary refill takes less than 2 seconds.     Coloration: Skin is not cyanotic, jaundiced or pale.     Findings: No rash.  Neurological:     General: No focal deficit present.     Mental Status: She is alert and oriented to person, place, and time.     Sensory: Sensation is intact.     Motor: Motor function is intact.     Coordination: Coordination is intact.  Gait: Gait is intact.     Deep Tendon Reflexes: Reflexes are normal and symmetric.  Psychiatric:        Attention and Perception: Attention and perception normal.        Mood and Affect: Mood and affect normal.        Speech: Speech normal.        Behavior: Behavior normal. Behavior is cooperative.        Thought Content: Thought content normal.        Cognition and Memory: Cognition and memory normal.        Judgment: Judgment normal.    Physical Exam   MEASUREMENTS: WT- 128        Results for orders placed or performed in visit on 12/26/21  CBC with Differential/Platelet  Result Value Ref Range   WBC 7.0 3.4 - 10.8 x10E3/uL   RBC 4.27 3.77 - 5.28 x10E6/uL   Hemoglobin 13.3 11.1 - 15.9 g/dL    Hematocrit 16.1 09.6 - 46.6 %   MCV 91 79 - 97 fL   MCH 31.1 26.6 - 33.0 pg   MCHC 34.1 31.5 - 35.7 g/dL   RDW 04.5 40.9 - 81.1 %   Platelets 275 150 - 450 x10E3/uL   Neutrophils 60 Not Estab. %   Lymphs 30 Not Estab. %   Monocytes 8 Not Estab. %   Eos 2 Not Estab. %   Basos 0 Not Estab. %   Neutrophils Absolute 4.2 1.4 - 7.0 x10E3/uL   Lymphocytes Absolute 2.1 0.7 - 3.1 x10E3/uL   Monocytes Absolute 0.5 0.1 - 0.9 x10E3/uL   EOS (ABSOLUTE) 0.1 0.0 - 0.4 x10E3/uL   Basophils Absolute 0.0 0.0 - 0.2 x10E3/uL   Immature Granulocytes 0 Not Estab. %   Immature Grans (Abs) 0.0 0.0 - 0.1 x10E3/uL  CMP14+EGFR  Result Value Ref Range   Glucose 87 70 - 99 mg/dL   BUN 12 6 - 24 mg/dL   Creatinine, Ser 9.14 0.57 - 1.00 mg/dL   eGFR 91 >78 GN/FAO/1.30   BUN/Creatinine Ratio 15 9 - 23   Sodium 137 134 - 144 mmol/L   Potassium 4.1 3.5 - 5.2 mmol/L   Chloride 103 96 - 106 mmol/L   CO2 20 20 - 29 mmol/L   Calcium 9.0 8.7 - 10.2 mg/dL   Total Protein 6.9 6.0 - 8.5 g/dL   Albumin 4.2 3.9 - 4.9 g/dL   Globulin, Total 2.7 1.5 - 4.5 g/dL   Albumin/Globulin Ratio 1.6 1.2 - 2.2   Bilirubin Total 0.3 0.0 - 1.2 mg/dL   Alkaline Phosphatase 83 44 - 121 IU/L   AST 20 0 - 40 IU/L   ALT 23 0 - 32 IU/L  Lipid panel  Result Value Ref Range   Cholesterol, Total 229 (H) 100 - 199 mg/dL   Triglycerides 865 0 - 149 mg/dL   HDL 59 >78 mg/dL   VLDL Cholesterol Cal 22 5 - 40 mg/dL   LDL Chol Calc (NIH) 469 (H) 0 - 99 mg/dL   Chol/HDL Ratio 3.9 0.0 - 4.4 ratio  Thyroid Panel With TSH  Result Value Ref Range   TSH 2.080 0.450 - 4.500 uIU/mL   T4, Total 7.0 4.5 - 12.0 ug/dL   T3 Uptake Ratio 25 24 - 39 %   Free Thyroxine Index 1.8 1.2 - 4.9       Pertinent labs & imaging results that were available during my care of the patient were reviewed by me and  considered in my medical decision making.  Assessment & Plan:  Synai was seen today for medical management of chronic issues.  Diagnoses and all  orders for this visit:  Depression, major, single episode, mild (HCC) -     escitalopram (LEXAPRO) 10 MG tablet; Take 1 tablet (10 mg total) by mouth daily. -     Thyroid Panel With TSH  GAD (generalized anxiety disorder) -     escitalopram (LEXAPRO) 10 MG tablet; Take 1 tablet (10 mg total) by mouth daily. -     Thyroid Panel With TSH  Non-seasonal allergic rhinitis due to pollen -     fexofenadine (ALLEGRA) 180 MG tablet; Take 1 tablet (180 mg total) by mouth daily. -     montelukast (SINGULAIR) 10 MG tablet; Take 1 tablet (10 mg total) by mouth at bedtime.  Primary hypertension -     hydrochlorothiazide (HYDRODIURIL) 25 MG tablet; Take 1 tablet (25 mg total) by mouth daily. -     CMP14+EGFR -     CBC with Differential/Platelet -     Lipid panel -     Thyroid Panel With TSH  Chronic cough -     montelukast (SINGULAIR) 10 MG tablet; Take 1 tablet (10 mg total) by mouth at bedtime.  Encounter for screening mammogram for malignant neoplasm of breast -     MM 3D SCREENING MAMMOGRAM BILATERAL BREAST  Vitamin D deficiency -     VITAMIN D 25 Hydroxy (Vit-D Deficiency, Fractures)  Yeast vaginitis -     fluconazole (DIFLUCAN) 150 MG tablet; 1 po q week x 4 weeks     Assessment and Plan    Insomnia Difficulty sleeping despite using L-tanin. Patient has tried up to 15mg . -Increase L-tanin to 3mg  fast-acting and 10mg  long-acting.  Weight loss Significant weight loss from 169 to 128 after using Ozempic. Patient has since stopped Ozempic due to excessive weight loss and has maintained weight around 134. -No changes to current plan.  Asthma Patient is out of Singulair. -Refill Singulair prescription at [facility] for a year.  General Health Maintenance -Order labs as it has been over a year since last check. -Order mammogram at the right center due to history of fibrocystic changes and it being a couple of years since last mammogram. -Continue Lexapro as it is working well.           Continue all other maintenance medications.  Follow up plan: Return in about 1 year (around 01/13/2024), or if symptoms worsen or fail to improve, for CPE.   Continue healthy lifestyle choices, including diet (rich in fruits, vegetables, and lean proteins, and low in salt and simple carbohydrates) and exercise (at least 30 minutes of moderate physical activity daily).  Educational handout given for health maintenance  The above assessment and management plan was discussed with the patient. The patient verbalized understanding of and has agreed to the management plan. Patient is aware to call the clinic if they develop any new symptoms or if symptoms persist or worsen. Patient is aware when to return to the clinic for a follow-up visit. Patient educated on when it is appropriate to go to the emergency department.   Kari Baars, FNP-C [facility] [phone]

## 2023-01-14 ENCOUNTER — Ambulatory Visit: Payer: BC Managed Care – PPO | Admitting: Family Medicine

## 2023-01-14 LAB — CBC WITH DIFFERENTIAL/PLATELET
Basophils Absolute: 0 10*3/uL (ref 0.0–0.2)
Basos: 0 %
EOS (ABSOLUTE): 0.1 10*3/uL (ref 0.0–0.4)
Eos: 1 %
Hematocrit: 43.9 % (ref 34.0–46.6)
Hemoglobin: 14.1 g/dL (ref 11.1–15.9)
Immature Grans (Abs): 0 10*3/uL (ref 0.0–0.1)
Immature Granulocytes: 0 %
Lymphocytes Absolute: 2.8 10*3/uL (ref 0.7–3.1)
Lymphs: 31 %
MCH: 31.2 pg (ref 26.6–33.0)
MCHC: 32.1 g/dL (ref 31.5–35.7)
MCV: 97 fL (ref 79–97)
Monocytes Absolute: 0.7 10*3/uL (ref 0.1–0.9)
Monocytes: 7 %
Neutrophils Absolute: 5.6 10*3/uL (ref 1.4–7.0)
Neutrophils: 61 %
Platelets: 334 10*3/uL (ref 150–450)
RBC: 4.52 x10E6/uL (ref 3.77–5.28)
RDW: 12.6 % (ref 11.7–15.4)
WBC: 9.2 10*3/uL (ref 3.4–10.8)

## 2023-01-14 LAB — LIPID PANEL
Chol/HDL Ratio: 3 ratio (ref 0.0–4.4)
Cholesterol, Total: 233 mg/dL — ABNORMAL HIGH (ref 100–199)
HDL: 78 mg/dL (ref >39)
LDL Chol Calc (NIH): 129 mg/dL — ABNORMAL HIGH (ref 0–99)
Triglycerides: 153 mg/dL — ABNORMAL HIGH (ref 0–149)
VLDL Cholesterol Cal: 26 mg/dL (ref 5–40)

## 2023-01-14 LAB — CMP14+EGFR
ALT: 14 [IU]/L (ref 0–32)
AST: 21 [IU]/L (ref 0–40)
Albumin: 4.6 g/dL (ref 3.9–4.9)
Alkaline Phosphatase: 78 [IU]/L (ref 44–121)
BUN/Creatinine Ratio: 18 (ref 9–23)
BUN: 15 mg/dL (ref 6–24)
Bilirubin Total: <0.2 mg/dL (ref 0.0–1.2)
CO2: 24 mmol/L (ref 20–29)
Calcium: 9.9 mg/dL (ref 8.7–10.2)
Chloride: 99 mmol/L (ref 96–106)
Creatinine, Ser: 0.85 mg/dL (ref 0.57–1.00)
Globulin, Total: 2.7 g/dL (ref 1.5–4.5)
Glucose: 92 mg/dL (ref 70–99)
Potassium: 4.3 mmol/L (ref 3.5–5.2)
Sodium: 137 mmol/L (ref 134–144)
Total Protein: 7.3 g/dL (ref 6.0–8.5)
eGFR: 84 mL/min/{1.73_m2} (ref >59)

## 2023-01-14 LAB — THYROID PANEL WITH TSH
Free Thyroxine Index: 1.6 (ref 1.2–4.9)
T3 Uptake Ratio: 22 % — ABNORMAL LOW (ref 24–39)
T4, Total: 7.4 ug/dL (ref 4.5–12.0)
TSH: 1.67 u[IU]/mL (ref 0.450–4.500)

## 2023-01-14 LAB — VITAMIN D 25 HYDROXY (VIT D DEFICIENCY, FRACTURES): Vit D, 25-Hydroxy: 23.6 ng/mL — ABNORMAL LOW (ref 30.0–100.0)

## 2024-02-29 ENCOUNTER — Other Ambulatory Visit: Payer: Self-pay | Admitting: Family Medicine

## 2024-02-29 ENCOUNTER — Encounter: Payer: Self-pay | Admitting: Family Medicine

## 2024-02-29 DIAGNOSIS — I10 Essential (primary) hypertension: Secondary | ICD-10-CM

## 2024-02-29 NOTE — Telephone Encounter (Signed)
 Sara Sweeney NTBS Last OV 01/13/23 NO RF sent to pharmacy last OV greater than a year

## 2024-02-29 NOTE — Telephone Encounter (Signed)
 LMTCB to make an appt w/[facility] for med refill. Also, I sent pt a letter about this!

## 2024-03-19 ENCOUNTER — Encounter: Payer: Self-pay | Admitting: Family Medicine

## 2024-03-23 ENCOUNTER — Other Ambulatory Visit: Payer: Self-pay | Admitting: Family Medicine

## 2024-03-23 ENCOUNTER — Other Ambulatory Visit: Payer: Self-pay

## 2024-03-23 DIAGNOSIS — I10 Essential (primary) hypertension: Secondary | ICD-10-CM

## 2024-03-23 MED ORDER — HYDROCHLOROTHIAZIDE 25 MG PO TABS: 25.0000 mg | ORAL_TABLET | Freq: Every day | ORAL | 0 refills | Status: DC

## 2024-03-23 NOTE — Telephone Encounter (Signed)
 Handled over the phone. 30 day supply given to pt per PCP.

## 2024-03-23 NOTE — Telephone Encounter (Signed)
 Copied from CRM #[id number]. Topic: Clinical - Medication Refill >> Mar 23, 2024  9:29 AM Larissa RAMAN wrote: Medication:  hydrochlorothiazide  (HYDRODIURIL ) 25 MG tablet  Has the patient contacted their pharmacy? Yes (Agent: If no, request that the patient contact the pharmacy for the refill. If patient does not wish to contact the pharmacy document the reason why and proceed with request.) (Agent: If yes, when and what did the pharmacy advise?)  This is the patient's preferred pharmacy:  [id number] [place],  - [address] [facility] [address] [place] [address] Phone: [phone] Fax: [phone]  Is this the correct pharmacy for this prescription? Yes If no, delete pharmacy and type the correct one.   Has the prescription been filled recently? No  Is the patient out of the medication? Yes  Has the patient been seen for an appointment in the last year OR does the patient have an upcoming appointment? Yes. Physical scheduled for 05/21 and added to waitlist  Can we respond through MyChart? Yes  Agent: Please be advised that Rx refills may take up to 3 business days. We ask that you follow-up with your pharmacy.

## 2024-03-26 ENCOUNTER — Other Ambulatory Visit: Payer: Self-pay | Admitting: Family Medicine

## 2024-03-26 DIAGNOSIS — F320 Major depressive disorder, single episode, mild: Secondary | ICD-10-CM

## 2024-03-26 DIAGNOSIS — F411 Generalized anxiety disorder: Secondary | ICD-10-CM

## 2024-04-04 ENCOUNTER — Other Ambulatory Visit: Payer: Self-pay | Admitting: Family Medicine

## 2024-04-04 ENCOUNTER — Telehealth: Payer: Self-pay | Admitting: Family Medicine

## 2024-04-04 DIAGNOSIS — I10 Essential (primary) hypertension: Secondary | ICD-10-CM

## 2024-04-04 MED ORDER — HYDROCHLOROTHIAZIDE 25 MG PO TABS: 25.0000 mg | ORAL_TABLET | Freq: Every day | ORAL | 0 refills | Status: AC

## 2024-04-04 NOTE — Telephone Encounter (Unsigned)
 Copied from CRM #[id number]. Topic: Clinical - Prescription Issue >> Apr 04, 2024  3:17 PM Sara Sweeney wrote: Reason for CRM: Patient called in stating she would need more hydrochlorothiazide  (HYDRODIURIL ) 25 MG tablet stated she doesn't have enough to last until next appointment. Rescheduled due to weather/can't leave driveway

## 2024-04-05 ENCOUNTER — Encounter: Payer: Self-pay | Admitting: Family Medicine

## 2024-04-05 NOTE — Telephone Encounter (Signed)
 Pt aware and scheduled for chronic follow up and med refills 2/6 and still has CPE scheduled in June and is aware she still needs to keep that appt.

## 2024-04-14 ENCOUNTER — Ambulatory Visit: Payer: Self-pay | Admitting: Family Medicine

## 2024-04-14 ENCOUNTER — Encounter: Payer: Self-pay | Admitting: Family Medicine

## 2024-04-19 ENCOUNTER — Encounter: Admitting: Family Medicine

## 2024-07-27 ENCOUNTER — Encounter: Payer: Self-pay | Admitting: Family Medicine

## 2024-08-09 ENCOUNTER — Encounter: Payer: Self-pay | Admitting: Family Medicine
